# Patient Record
Sex: Female | Born: 1945 | Hispanic: Yes | Marital: Married | Smoking: Former smoker
Health system: Southern US, Community
[De-identification: ages and names within clinical notes are randomized; demographics above are authoritative.]

## PROBLEM LIST (undated history)

## (undated) DIAGNOSIS — K219 Gastro-esophageal reflux disease without esophagitis: Secondary | ICD-10-CM

## (undated) DIAGNOSIS — E7800 Pure hypercholesterolemia, unspecified: Secondary | ICD-10-CM

## (undated) DIAGNOSIS — K2970 Gastritis, unspecified, without bleeding: Secondary | ICD-10-CM

## (undated) DIAGNOSIS — M1990 Unspecified osteoarthritis, unspecified site: Secondary | ICD-10-CM

## (undated) DIAGNOSIS — I10 Essential (primary) hypertension: Secondary | ICD-10-CM

## (undated) DIAGNOSIS — E119 Type 2 diabetes mellitus without complications: Secondary | ICD-10-CM

## (undated) DIAGNOSIS — Z8719 Personal history of other diseases of the digestive system: Secondary | ICD-10-CM

## (undated) DIAGNOSIS — F419 Anxiety disorder, unspecified: Secondary | ICD-10-CM

## (undated) HISTORY — PX: HEMORRHOID SURGERY: SHX153

## (undated) HISTORY — PX: ABDOMINAL HYSTERECTOMY: SHX81

## (undated) HISTORY — PX: UTERINE FIBROID SURGERY: SHX826

## (undated) HISTORY — PX: BREAST BIOPSY: SHX20

## (undated) HISTORY — PX: CATARACT EXTRACTION W/ INTRAOCULAR LENS  IMPLANT, BILATERAL: SHX1307

---

## 2008-06-11 HISTORY — PX: HIP SURGERY: SHX245

## 2009-06-11 HISTORY — PX: JOINT REPLACEMENT: SHX530

## 2016-02-12 ENCOUNTER — Ambulatory Visit (HOSPITAL_COMMUNITY)
Admission: RE | Admit: 2016-02-12 | Discharge: 2016-02-12 | Disposition: A | Discharge status: Home / Self Care | Payer: Medicaid Other | Source: Ambulatory Visit | Attending: Family Medicine | Admitting: Family Medicine

## 2016-02-12 ENCOUNTER — Other Ambulatory Visit: Payer: Self-pay | Admitting: Family Medicine

## 2016-02-12 DIAGNOSIS — M25562 Pain in left knee: Secondary | ICD-10-CM

## 2016-02-12 DIAGNOSIS — Z029 Encounter for administrative examinations, unspecified: Secondary | ICD-10-CM | POA: Diagnosis present

## 2016-05-08 ENCOUNTER — Other Ambulatory Visit: Payer: Self-pay | Admitting: Internal Medicine

## 2016-05-08 DIAGNOSIS — N6092 Unspecified benign mammary dysplasia of left breast: Secondary | ICD-10-CM

## 2016-05-14 ENCOUNTER — Other Ambulatory Visit: Payer: Self-pay | Admitting: Internal Medicine

## 2016-05-14 DIAGNOSIS — N6092 Unspecified benign mammary dysplasia of left breast: Secondary | ICD-10-CM

## 2016-05-23 ENCOUNTER — Ambulatory Visit
Admission: RE | Admit: 2016-05-23 | Discharge: 2016-05-23 | Disposition: A | Discharge status: Home / Self Care | Payer: Medicare HMO | Source: Ambulatory Visit | Attending: Internal Medicine | Admitting: Internal Medicine

## 2016-05-23 ENCOUNTER — Other Ambulatory Visit: Payer: Self-pay | Admitting: Internal Medicine

## 2016-05-23 DIAGNOSIS — Z1231 Encounter for screening mammogram for malignant neoplasm of breast: Secondary | ICD-10-CM

## 2016-05-23 DIAGNOSIS — N6092 Unspecified benign mammary dysplasia of left breast: Secondary | ICD-10-CM

## 2017-01-23 NOTE — Progress Notes (Signed)
Patient called Short Stay regarding 9/5 surgery date.  Used a Research officer, trade unionspanish interpreter number [phone]. Verified name and date of birth.  Patient wanted to know when her pre-op appointment would be within 2 weeks of surgery

## 2017-02-04 ENCOUNTER — Other Ambulatory Visit: Payer: Self-pay | Admitting: Orthopedic Surgery

## 2017-02-05 NOTE — Pre-Procedure Instructions (Signed)
Instrucciones Para Antes de la Ciruga   Su ciruga est programada para-(your procedure is scheduled on) September 5 at 1100   [facility] Admitting - (enter)    Por favor llame al [phone] si tiene algn problema en la maana de la ciruga. (please call if you have any problems the morning of surgery.)                  Recuerde: (Remember)   No coma alimentos ni tome lquidos, incluyendo agua, despus de la medianoche del  (Do not eat food or drink liquids including water after midnight on_______________   [facility] en la maana de la ciruga con un SORBITO de agua (take these meds the morning of surgery with a SIP of water) ________________________________ acetaminophen (TYLENOL)  Eye drops,  omeprazole (PRILOSEC)    Puede cepillarse los dientes en la maana de la Arnold. (you may brush your teeth the morning of surgery)   No use joyas, maquillaje de ojos, lpiz labial, crema para el cuerpo o esmalte de uas oscuro. (Do not wear jewelry, eye makeup, lipstick, body lotion, or dark fingernail polish)     Si va a ser ingresado despues de la ciruga, deje la [employer] en el carro hasta que se le haya asignado una habitacin. (If you are to be admitted after surgery, leave suitcase in car until your room has been assigned.)   A los pacientes que se les d de alta el mismo da no se les permitir manejar a casa.  (Patients discharged on the day of surgery will not be allowed to drive home)   Use ropa suelta y cmoda de regreso a casa. (wear loose comfortable clothes for ride home)    Firma del paciente (patient signature) ______________________________________   [place]  02/05/2017      [id number] - [place], [place] - [address] AT [facility] [address] [facility] [address] [place] [address] Phone: [phone] Fax: [phone]    Your procedure is scheduled  on September 5  Report to [facility] Admitting at 1100 A.M.  Call this number if you have problems the morning of surgery:  [phone]   Remember:  Do not eat food or drink liquids after midnight.  Continue all other medications as directed by your physician except follow these instructions about you medications   Take these medicines the morning of surgery with A SIP OF WATER  acetaminophen (TYLENOL)  Eye drops,  omeprazole (PRILOSEC)    Do not wear jewelry, make-up or nail polish.  Do not wear lotions, powders, or perfumes, or deoderant.  Do not shave 48 hours prior to surgery.  Men may shave face and neck.  Do not bring valuables to the hospital.  [facility] is not responsible for any belongings or valuables.  Contacts, dentures or bridgework may not be worn into surgery.  Leave your suitcase in the car.  After surgery it may be brought to your room.  For patients admitted to the hospital, discharge time will be determined by your treatment team.  Patients discharged the day of surgery will not be allowed to drive home.   Special instructions:   [facility]- Preparing For Surgery  Before surgery, you can play an important role. Because skin is not sterile, your skin needs to be as free of germs as possible. You can reduce the number of germs on your skin by washing with  CHG (chlorahexidine gluconate) Soap before surgery.  CHG is an antiseptic cleaner which kills germs and bonds with the skin to continue killing germs even after washing.  Please do not use if you have an allergy to CHG or antibacterial soaps. If your skin becomes reddened/irritated stop using the CHG.  Do not shave (including legs and underarms) for at least 48 hours prior to first CHG shower. It is OK to shave your face.  Please follow these instructions carefully.   1. Shower the NIGHT BEFORE SURGERY and the MORNING OF SURGERY with CHG.   2. If you chose to wash your hair, wash your hair  first as usual with your normal shampoo.  3. After you shampoo, rinse your hair and body thoroughly to remove the shampoo.  4. Use CHG as you would any other liquid soap. You can apply CHG directly to the skin and wash gently with a scrungie or a clean washcloth.   5. Apply the CHG Soap to your body ONLY FROM THE NECK DOWN.  Do not use on open wounds or open sores. Avoid contact with your eyes, ears, mouth and genitals (private parts). Wash genitals (private parts) with your normal soap.  6. Wash thoroughly, paying special attention to the area where your surgery will be performed.  7. Thoroughly rinse your body with warm water from the neck down.  8. DO NOT shower/wash with your normal soap after using and rinsing off the CHG Soap.  9. Pat yourself dry with a CLEAN TOWEL.   10. Wear CLEAN PAJAMAS   11. Place CLEAN SHEETS on your bed the night of your first shower and DO NOT SLEEP WITH PETS.    Day of Surgery: Do not apply any deodorants/lotions. Please wear clean clothes to the hospital/surgery center.      Please read over the following fact sheets that you were given.

## 2017-02-06 ENCOUNTER — Ambulatory Visit (HOSPITAL_COMMUNITY)
Admission: RE | Admit: 2017-02-06 | Discharge: 2017-02-06 | Disposition: A | Discharge status: Home / Self Care | Payer: Medicare HMO | Source: Ambulatory Visit | Attending: Orthopedic Surgery | Admitting: Orthopedic Surgery

## 2017-02-06 ENCOUNTER — Encounter (HOSPITAL_COMMUNITY): Payer: Self-pay

## 2017-02-06 ENCOUNTER — Encounter (HOSPITAL_COMMUNITY)
Admission: RE | Admit: 2017-02-06 | Discharge: 2017-02-06 | Disposition: A | Discharge status: Home / Self Care | Payer: Medicare HMO | Source: Ambulatory Visit | Attending: Orthopedic Surgery | Admitting: Orthopedic Surgery

## 2017-02-06 DIAGNOSIS — M1611 Unilateral primary osteoarthritis, right hip: Secondary | ICD-10-CM | POA: Diagnosis not present

## 2017-02-06 DIAGNOSIS — Z01810 Encounter for preprocedural cardiovascular examination: Secondary | ICD-10-CM | POA: Diagnosis not present

## 2017-02-06 DIAGNOSIS — R001 Bradycardia, unspecified: Secondary | ICD-10-CM | POA: Diagnosis not present

## 2017-02-06 DIAGNOSIS — Z01818 Encounter for other preprocedural examination: Secondary | ICD-10-CM

## 2017-02-06 HISTORY — DX: Essential (primary) hypertension: I10

## 2017-02-06 HISTORY — DX: Type 2 diabetes mellitus without complications: E11.9

## 2017-02-06 HISTORY — DX: Gastro-esophageal reflux disease without esophagitis: K21.9

## 2017-02-06 HISTORY — DX: Personal history of other diseases of the digestive system: Z87.19

## 2017-02-06 HISTORY — DX: Unspecified osteoarthritis, unspecified site: M19.90

## 2017-02-06 HISTORY — DX: Pure hypercholesterolemia, unspecified: E78.00

## 2017-02-06 HISTORY — DX: Anxiety disorder, unspecified: F41.9

## 2017-02-06 LAB — BASIC METABOLIC PANEL
Anion gap: 8 (ref 5–15)
BUN: 27 mg/dL — ABNORMAL HIGH (ref 6–20)
CO2: 24 mmol/L (ref 22–32)
Calcium: 9.3 mg/dL (ref 8.9–10.3)
Chloride: 106 mmol/L (ref 101–111)
Creatinine, Ser: 1.09 mg/dL — ABNORMAL HIGH (ref 0.44–1.00)
GFR calc Af Amer: 58 mL/min — ABNORMAL LOW (ref >60)
GFR calc non Af Amer: 50 mL/min — ABNORMAL LOW (ref >60)
Glucose, Bld: 112 mg/dL — ABNORMAL HIGH (ref 65–99)
Potassium: 4.5 mmol/L (ref 3.5–5.1)
Sodium: 138 mmol/L (ref 135–145)

## 2017-02-06 LAB — URINALYSIS, ROUTINE W REFLEX MICROSCOPIC
Bilirubin Urine: NEGATIVE
Glucose, UA: NEGATIVE mg/dL
Hgb urine dipstick: NEGATIVE
Ketones, ur: NEGATIVE mg/dL
Leukocytes, UA: NEGATIVE
Nitrite: NEGATIVE
Protein, ur: NEGATIVE mg/dL
Specific Gravity, Urine: 1.009 (ref 1.005–1.030)
pH: 5 (ref 5.0–8.0)

## 2017-02-06 LAB — TYPE AND SCREEN
ABO/RH(D): O POS
Antibody Screen: NEGATIVE

## 2017-02-06 LAB — CBC WITH DIFFERENTIAL/PLATELET
Basophils Absolute: 0 10*3/uL (ref 0.0–0.1)
Basophils Relative: 0 %
Eosinophils Absolute: 0.2 10*3/uL (ref 0.0–0.7)
Eosinophils Relative: 2 %
HCT: 38.3 % (ref 36.0–46.0)
Hemoglobin: 12.5 g/dL (ref 12.0–15.0)
Lymphocytes Relative: 39 %
Lymphs Abs: 3.8 10*3/uL (ref 0.7–4.0)
MCH: 31.9 pg (ref 26.0–34.0)
MCHC: 32.6 g/dL (ref 30.0–36.0)
MCV: 97.7 fL (ref 78.0–100.0)
Monocytes Absolute: 1.1 10*3/uL — ABNORMAL HIGH (ref 0.1–1.0)
Monocytes Relative: 12 %
Neutro Abs: 4.5 10*3/uL (ref 1.7–7.7)
Neutrophils Relative %: 47 %
Platelets: 273 10*3/uL (ref 150–400)
RBC: 3.92 MIL/uL (ref 3.87–5.11)
RDW: 12.9 % (ref 11.5–15.5)
WBC: 9.5 10*3/uL (ref 4.0–10.5)

## 2017-02-06 LAB — ABO/RH: ABO/RH(D): O POS

## 2017-02-06 LAB — SURGICAL PCR SCREEN
MRSA, PCR: NEGATIVE
Staphylococcus aureus: NEGATIVE

## 2017-02-06 LAB — PROTIME-INR
INR: 0.91
Prothrombin Time: 12.2 seconds (ref 11.4–15.2)

## 2017-02-06 LAB — APTT: aPTT: 28 seconds (ref 24–36)

## 2017-02-06 NOTE — Progress Notes (Signed)
PCP - Dr. Luiz Ironabeza Cardiologist - patient denies  Chest x-ray - 02/06/2017 EKG - 02/06/2017 Stress Test - patient denies ECHO - patient denies Cardiac Cath - patient denies  Sleep Study - patient denies   Fasting Blood Sugar - 90-100's Checks Blood Sugar 1-2 times a day     Patient denies shortness of breath, fever, cough and chest pain at PAT appointment   Interpretive services via the iPad were used for this appointment.  Patient verbalized understanding of instructions that were given to them at the PAT appointment. Patient was also instructed that they will need to review over the PAT instructions again at home before surgery.

## 2017-02-08 ENCOUNTER — Encounter: Payer: Self-pay | Admitting: Internal Medicine

## 2017-02-08 ENCOUNTER — Ambulatory Visit (INDEPENDENT_AMBULATORY_CARE_PROVIDER_SITE_OTHER): Payer: Medicare HMO | Admitting: Internal Medicine

## 2017-02-08 VITALS — BP 166/78 | HR 73 | Ht 61.0 in | Wt 154.6 lb

## 2017-02-08 DIAGNOSIS — M1611 Unilateral primary osteoarthritis, right hip: Secondary | ICD-10-CM | POA: Diagnosis present

## 2017-02-08 DIAGNOSIS — Z01810 Encounter for preprocedural cardiovascular examination: Secondary | ICD-10-CM

## 2017-02-08 DIAGNOSIS — E782 Mixed hyperlipidemia: Secondary | ICD-10-CM

## 2017-02-08 DIAGNOSIS — I10 Essential (primary) hypertension: Secondary | ICD-10-CM

## 2017-02-08 NOTE — Patient Instructions (Signed)
Your physician recommends that you schedule a follow-up appointment as needed  

## 2017-02-08 NOTE — H&P (Signed)
TOTAL HIP ADMISSION H&P  Patient is admitted for right total hip arthroplasty.  Subjective:  Chief Complaint: right hip pain  HPI: Madison Myers, 71 y.o. female, has a history of pain and functional disability in the right hip(s) due to arthritis and patient has failed non-surgical conservative treatments for greater than 12 weeks to include NSAID's and/or analgesics, corticosteriod injections, flexibility and strengthening excercises, use of assistive devices, weight reduction as appropriate and activity modification.  Onset of symptoms was gradual starting 2 years ago with gradually worsening course since that time.The patient noted no past surgery on the right hip(s).  Patient currently rates pain in the right hip at 10 out of 10 with activity. Patient has night pain, worsening of pain with activity and weight bearing, pain that interfers with activities of daily living and pain with passive range of motion. Patient has evidence of joint space narrowing by imaging studies. This condition presents safety issues increasing the risk of falls.  There is no current active infection.  There are no active problems to display for this patient.  Past Medical History:  Diagnosis Date  . Anxiety    takes medication as needed  . Arthritis   . Diabetes mellitus without complication (HCC)    controlled with no complications - is not currently taking any medications 02/06/17  . GERD (gastroesophageal reflux disease)   . High cholesterol   . History of hiatal hernia   . Hypertension     Past Surgical History:  Procedure Laterality Date  . ABDOMINAL HYSTERECTOMY    . CATARACT EXTRACTION W/ INTRAOCULAR LENS  IMPLANT, BILATERAL    . HEMORRHOID SURGERY    . HIP SURGERY Left 2010   in [place]  . JOINT REPLACEMENT Left 2011   hip  . UTERINE FIBROID SURGERY      No prescriptions prior to admission.   Allergies  Allergen Reactions  . Carbamazepine Anaphylaxis    Social History  Substance Use  Topics  . Smoking status: Former Smoker    Quit date: 1998  . Smokeless tobacco: Never Used  . Alcohol use No    No family history on file.   Review of Systems  Constitutional: Negative.   HENT: Negative.   Eyes: Negative.   Respiratory: Negative.   Cardiovascular:       HTN  Gastrointestinal: Negative.   Genitourinary: Negative.   Musculoskeletal: Positive for joint pain.  Skin: Negative.   Neurological: Negative.   Endo/Heme/Allergies: Negative.   Psychiatric/Behavioral: Positive for memory loss.    Objective:  Physical Exam  Constitutional: She is oriented to person, place, and time. She appears well-developed and well-nourished.  HENT:  Head: Normocephalic and atraumatic.  Eyes: Pupils are equal, round, and reactive to light.  Neck: Normal range of motion. Neck supple.  Cardiovascular: Intact distal pulses.   Respiratory: Effort normal.  Musculoskeletal:  She continues to have significant pain in the right hip with any internal or external rotation.  She is able to internally rotate to 15-20.  Mild pain in the groin and mild lateral tenderness.  Calves are soft and nontender.  She is neurovascularly intact distally.    Neurological: She is alert and oriented to person, place, and time.  Skin: Skin is warm and dry.  Psychiatric: She has a normal mood and affect. Her behavior is normal. Judgment and thought content normal.    Vital signs in last 24 hours:    Labs:   Estimated body mass index is 28.57 kg/m as  calculated from the following:   Height as of 02/06/17: 5\' 2"  (1.575 m).   Weight as of 02/06/17: 70.9 kg (156 lb 3.2 oz).   Imaging Review Plain radiographs demonstrate  AP pelvis and crosstable lateral of the right and left hips are taken and reviewed in office today.  This shows a left total hip arthroplasty with multiple cerclage wires over the mid femur region.  Patient's right hip does have near end-stage bone-on-bone arthritis over the superior  weightbearing surface.  Assessment/Plan:  End stage arthritis, right hip(s)  The patient history, physical examination, clinical judgement of the provider and imaging studies are consistent with end stage degenerative joint disease of the right hip(s) and total hip arthroplasty is deemed medically necessary. The treatment options including medical management, injection therapy, arthroscopy and arthroplasty were discussed at length. The risks and benefits of total hip arthroplasty were presented and reviewed. The risks due to aseptic loosening, infection, stiffness, dislocation/subluxation,  thromboembolic complications and other imponderables were discussed.  The patient acknowledged the explanation, agreed to proceed with the plan and consent was signed. Patient is being admitted for inpatient treatment for surgery, pain control, PT, OT, prophylactic antibiotics, VTE prophylaxis, progressive ambulation and ADL's and discharge planning.The patient is planning to be discharged to skilled nursing facility

## 2017-02-08 NOTE — Progress Notes (Signed)
OFFICE CONSULT NOTE  Chief Complaint:  Preoperative risk asssessment  Primary Care Physician: [facility]  HPI:  Madison Myers is a 71 y.o. female who is being seen today for the evaluation of preoperative risk assessment at the request of No ref. provider found. This is a pleasant 71 yo [name] who has a history of left hip surgery in [place] which was subsequently revised in [place]. She now presents for worsening right hip pain and had an abnormal preoperative EKG therefore cardiac clearance is requested. I reviewed her EKG which demonstrated a sinus bradycardia and was interpreted by the computer as inferior infarct. The EKG shows significant lead artifact with very low voltage in aVF therefore diagnosis is clouded. We repeated the EKG today in the office which shows normal sinus rhythm and again some inferior lead artifact but no evidence of clear Q waves or prior infarct. She denies any prior history of chest pain. She denies any current chest pain or worsening shortness of breath. She can walk up a flight of stairs without shortness of breath. There is no family history of coronary disease of early onset. She does have some cardiac risk factors including hypertension and dyslipidemia which appear to be well treated.  PMHx:  Past Medical History:  Diagnosis Date  . Anxiety    takes medication as needed  . Arthritis   . Diabetes mellitus without complication (HCC)    controlled with no complications - is not currently taking any medications 02/06/17  . GERD (gastroesophageal reflux disease)   . High cholesterol   . History of hiatal hernia   . Hypertension     Past Surgical History:  Procedure Laterality Date  . ABDOMINAL HYSTERECTOMY    . CATARACT EXTRACTION W/ INTRAOCULAR LENS  IMPLANT, BILATERAL    . HEMORRHOID SURGERY    . HIP SURGERY Left 2010   in [place]  . JOINT REPLACEMENT Left 2011   hip  . UTERINE FIBROID SURGERY       FAMHx:  Family History  Problem Relation Age of Onset  . Breast cancer Mother   . Liver cancer Sister     SOCHx:   reports that she quit smoking about 20 years ago. She has never used smokeless tobacco. She reports that she does not drink alcohol or use drugs.  ALLERGIES:  Allergies  Allergen Reactions  . Carbamazepine Anaphylaxis    ROS: Pertinent items noted in HPI and remainder of comprehensive ROS otherwise negative.  HOME MEDS: Current Outpatient Prescriptions on File Prior to Visit  Medication Sig Dispense Refill  . acetaminophen (TYLENOL) 650 MG CR tablet Take 650 mg by mouth every 8 (eight) hours as needed for pain.    . Cholecalciferol (VITAMIN D PO) Take 1 tablet by mouth daily.    Marland Kitchen conjugated estrogens (PREMARIN) vaginal cream Place 1 Applicatorful vaginally every 7 (seven) days.    Marland Kitchen latanoprost (XALATAN) 0.005 % ophthalmic solution Place 1 drop into both eyes at bedtime.    Marland Kitchen lisinopril-hydrochlorothiazide (PRINZIDE,ZESTORETIC) 10-12.5 MG tablet Take 1 tablet by mouth daily.    . magnesium citrate SOLN Take 1 Bottle by mouth once.    . Omega-3 Fatty Acids (FISH OIL PO) Take 1 capsule by mouth at bedtime.    Marland Kitchen omeprazole (PRILOSEC) 20 MG capsule Take 20 mg by mouth daily as needed (heartburn).    . polyvinyl alcohol (LIQUIFILM TEARS) 1.4 % ophthalmic solution Place 1 drop into both eyes every 4 (four) hours  as needed for dry eyes.    . vitamin E 200 UNIT capsule Take 200 Units by mouth daily.     No current facility-administered medications on file prior to visit.     LABS/IMAGING: No results found for this or any previous visit (from the past 48 hour(s)). No results found.  LIPID PANEL: No results found for: CHOL, TRIG, HDL, CHOLHDL, VLDL, LDLCALC, LDLDIRECT  WEIGHTS: Wt Readings from Last 3 Encounters:  02/08/17 154 lb 9.6 oz (70.1 kg)  02/06/17 156 lb 3.2 oz (70.9 kg)    VITALS: BP (!) 166/78   Pulse 73   Ht 5\' 1"  (1.549 m)   Wt 154 lb 9.6 oz  (70.1 kg)   SpO2 96%   BMI 29.21 kg/m   EXAM: General appearance: alert and no distress Neck: no carotid bruit, no JVD and thyroid not enlarged, symmetric, no tenderness/mass/nodules Lungs: clear to auscultation bilaterally Heart: regular rate and rhythm, S1, S2 normal, no murmur, click, rub or gallop Abdomen: soft, non-tender; bowel sounds normal; no masses,  no organomegaly Extremities: extremities normal, atraumatic, no cyanosis or edema Pulses: 2+ and symmetric Skin: Skin color, texture, turgor normal. No rashes or lesions Neurologic: Grossly normal Psych: Pleasant  EKG: Normal sinus rhythm at 67 - personally reviewed  ASSESSMENT: 1. Low risk for upcoming hip surgery 2. Hypertension-controlled 3. Dyslipidemia  PLAN: 1.   [name] is at low risk for upcoming hip surgery. Her EKG shows sinus rhythm without prior infarct. Blood pressure was repeated at 140/80. She reports generally blood pressure is around 120 to 130 systolic at home. Overall she should be at low risk for upcoming surgery. Follow-up with me as needed.  Thanks for the consultation.  Chrystie NoseKenneth C. Jahlen Bollman, MD, [facility] Limited PartnershipFACC  [place]  [facility]  Attending Cardiologist  Direct Dial: [phone]  Fax: [phone]  Website:  [email or web address]  Judeth Gilles C Jaqlyn Gruenhagen 02/08/2017, 4:58 PM

## 2017-02-12 ENCOUNTER — Ambulatory Visit: Payer: Medicare HMO | Admitting: Internal Medicine

## 2017-02-12 MED ORDER — BUPIVACAINE LIPOSOME 1.3 % IJ SUSP
20.0000 mL | Freq: Once | INTRAMUSCULAR | Status: DC
  Filled 2017-02-12: qty 20

## 2017-02-12 MED ORDER — TRANEXAMIC ACID 1000 MG/10ML IV SOLN
1000.0000 mg | INTRAVENOUS | Status: AC
  Administered 2017-02-13: 1000 mg via INTRAVENOUS
  Administered 2017-02-13: 13:00:00 via INTRAVENOUS
  Filled 2017-02-12: qty 1100

## 2017-02-12 MED ORDER — TRANEXAMIC ACID 1000 MG/10ML IV SOLN
2000.0000 mg | INTRAVENOUS | Status: DC
  Filled 2017-02-12: qty 20

## 2017-02-13 ENCOUNTER — Inpatient Hospital Stay (HOSPITAL_COMMUNITY): Payer: Medicare HMO | Admitting: Anesthesiology

## 2017-02-13 ENCOUNTER — Inpatient Hospital Stay (HOSPITAL_COMMUNITY): Payer: Medicare HMO

## 2017-02-13 ENCOUNTER — Inpatient Hospital Stay (HOSPITAL_COMMUNITY)
Admission: RE | Admit: 2017-02-13 | Discharge: 2017-02-16 | DRG: 470 | Disposition: A | Discharge status: Home Health | Payer: Medicare HMO | Source: Ambulatory Visit | Attending: Orthopedic Surgery | Admitting: Orthopedic Surgery

## 2017-02-13 ENCOUNTER — Encounter (HOSPITAL_COMMUNITY)
Admission: RE | Disposition: A | Discharge status: Home Health | Payer: Self-pay | Source: Ambulatory Visit | Attending: Orthopedic Surgery

## 2017-02-13 ENCOUNTER — Encounter (HOSPITAL_COMMUNITY): Payer: Self-pay | Admitting: Anesthesiology

## 2017-02-13 DIAGNOSIS — Z87891 Personal history of nicotine dependence: Secondary | ICD-10-CM

## 2017-02-13 DIAGNOSIS — K449 Diaphragmatic hernia without obstruction or gangrene: Secondary | ICD-10-CM | POA: Diagnosis present

## 2017-02-13 DIAGNOSIS — F419 Anxiety disorder, unspecified: Secondary | ICD-10-CM | POA: Diagnosis not present

## 2017-02-13 DIAGNOSIS — E119 Type 2 diabetes mellitus without complications: Secondary | ICD-10-CM | POA: Diagnosis not present

## 2017-02-13 DIAGNOSIS — K219 Gastro-esophageal reflux disease without esophagitis: Secondary | ICD-10-CM | POA: Diagnosis not present

## 2017-02-13 DIAGNOSIS — Z419 Encounter for procedure for purposes other than remedying health state, unspecified: Secondary | ICD-10-CM

## 2017-02-13 DIAGNOSIS — M1611 Unilateral primary osteoarthritis, right hip: Secondary | ICD-10-CM | POA: Diagnosis not present

## 2017-02-13 DIAGNOSIS — D62 Acute posthemorrhagic anemia: Secondary | ICD-10-CM | POA: Diagnosis not present

## 2017-02-13 DIAGNOSIS — I10 Essential (primary) hypertension: Secondary | ICD-10-CM | POA: Diagnosis present

## 2017-02-13 HISTORY — PX: TOTAL HIP ARTHROPLASTY: SHX124

## 2017-02-13 LAB — GLUCOSE, CAPILLARY
Glucose-Capillary: 83 mg/dL (ref 65–99)
Glucose-Capillary: 102 mg/dL — ABNORMAL HIGH (ref 65–99)

## 2017-02-13 SURGERY — ARTHROPLASTY, HIP, TOTAL, ANTERIOR APPROACH
Anesthesia: Spinal | Laterality: Right

## 2017-02-13 MED ORDER — ACETAMINOPHEN 325 MG PO TABS
650.0000 mg | ORAL_TABLET | Freq: Four times a day (QID) | ORAL | Status: DC | PRN
  Administered 2017-02-14: 650 mg via ORAL
  Filled 2017-02-13: qty 2

## 2017-02-13 MED ORDER — ONDANSETRON HCL 4 MG/2ML IJ SOLN
INTRAMUSCULAR | Status: DC | PRN
  Administered 2017-02-13: 4 mg via INTRAVENOUS

## 2017-02-13 MED ORDER — ASPIRIN EC 325 MG PO TBEC
325.0000 mg | DELAYED_RELEASE_TABLET | Freq: Every day | ORAL | Status: DC
  Administered 2017-02-14 – 2017-02-16 (×3): 325 mg via ORAL
  Filled 2017-02-13 (×3): qty 1

## 2017-02-13 MED ORDER — HYDROCHLOROTHIAZIDE 12.5 MG PO CAPS
12.5000 mg | ORAL_CAPSULE | Freq: Every day | ORAL | Status: DC
  Administered 2017-02-14 – 2017-02-15 (×2): 12.5 mg via ORAL
  Filled 2017-02-13 (×3): qty 1

## 2017-02-13 MED ORDER — CEFAZOLIN SODIUM-DEXTROSE 2-4 GM/100ML-% IV SOLN
INTRAVENOUS | Status: AC
  Filled 2017-02-13: qty 100

## 2017-02-13 MED ORDER — ONDANSETRON HCL 4 MG/2ML IJ SOLN: 4.0000 mg | Freq: Four times a day (QID) | INTRAMUSCULAR | Status: DC | PRN

## 2017-02-13 MED ORDER — ACETAMINOPHEN 650 MG RE SUPP: 650.0000 mg | Freq: Four times a day (QID) | RECTAL | Status: DC | PRN

## 2017-02-13 MED ORDER — LACTATED RINGERS IV SOLN
INTRAVENOUS | Status: DC
  Administered 2017-02-13: 11:00:00 via INTRAVENOUS

## 2017-02-13 MED ORDER — TIZANIDINE HCL 2 MG PO TABS: 2.0000 mg | ORAL_TABLET | Freq: Four times a day (QID) | ORAL | 0 refills | Status: AC | PRN

## 2017-02-13 MED ORDER — PHENOL 1.4 % MT LIQD: 1.0000 | OROMUCOSAL | Status: DC | PRN

## 2017-02-13 MED ORDER — LISINOPRIL-HYDROCHLOROTHIAZIDE 10-12.5 MG PO TABS: 1.0000 | ORAL_TABLET | Freq: Every day | ORAL | Status: DC

## 2017-02-13 MED ORDER — METOCLOPRAMIDE HCL 5 MG/ML IJ SOLN: 5.0000 mg | Freq: Three times a day (TID) | INTRAMUSCULAR | Status: DC | PRN

## 2017-02-13 MED ORDER — PROPOFOL 500 MG/50ML IV EMUL
INTRAVENOUS | Status: DC | PRN
  Administered 2017-02-13: 75 ug/kg/min via INTRAVENOUS

## 2017-02-13 MED ORDER — SODIUM CHLORIDE 0.9 % IV SOLN
1000.0000 mg | Freq: Once | INTRAVENOUS | Status: AC
  Administered 2017-02-13: 1000 mg via INTRAVENOUS
  Filled 2017-02-13: qty 10

## 2017-02-13 MED ORDER — PANTOPRAZOLE SODIUM 40 MG PO TBEC
40.0000 mg | DELAYED_RELEASE_TABLET | Freq: Every day | ORAL | Status: DC
  Administered 2017-02-13 – 2017-02-16 (×4): 40 mg via ORAL
  Filled 2017-02-13 (×4): qty 1

## 2017-02-13 MED ORDER — BUPIVACAINE-EPINEPHRINE (PF) 0.5% -1:200000 IJ SOLN
INTRAMUSCULAR | Status: AC
  Filled 2017-02-13: qty 30

## 2017-02-13 MED ORDER — DEXAMETHASONE SODIUM PHOSPHATE 10 MG/ML IJ SOLN
INTRAMUSCULAR | Status: AC
  Filled 2017-02-13: qty 1

## 2017-02-13 MED ORDER — BUPIVACAINE IN DEXTROSE 0.75-8.25 % IT SOLN
INTRATHECAL | Status: DC | PRN
  Administered 2017-02-13: 10 mg via INTRATHECAL

## 2017-02-13 MED ORDER — EPHEDRINE SULFATE-NACL 50-0.9 MG/10ML-% IV SOSY
PREFILLED_SYRINGE | INTRAVENOUS | Status: DC | PRN
  Administered 2017-02-13 (×2): 5 mg via INTRAVENOUS

## 2017-02-13 MED ORDER — ASPIRIN EC 325 MG PO TBEC: 325.0000 mg | DELAYED_RELEASE_TABLET | Freq: Two times a day (BID) | ORAL | 0 refills | Status: AC

## 2017-02-13 MED ORDER — BUPIVACAINE-EPINEPHRINE (PF) 0.5% -1:200000 IJ SOLN
INTRAMUSCULAR | Status: DC | PRN
  Administered 2017-02-13: 5 mL via PERINEURAL
  Administered 2017-02-13: 45 mL via PERINEURAL

## 2017-02-13 MED ORDER — PHENYLEPHRINE HCL 10 MG/ML IJ SOLN
INTRAMUSCULAR | Status: DC | PRN
  Administered 2017-02-13: 15 ug/min via INTRAVENOUS

## 2017-02-13 MED ORDER — ONDANSETRON HCL 4 MG/2ML IJ SOLN
INTRAMUSCULAR | Status: AC
  Filled 2017-02-13: qty 2

## 2017-02-13 MED ORDER — LATANOPROST 0.005 % OP SOLN
1.0000 [drp] | Freq: Every day | OPHTHALMIC | Status: DC
  Administered 2017-02-13 – 2017-02-15 (×3): 1 [drp] via OPHTHALMIC
  Filled 2017-02-13: qty 2.5

## 2017-02-13 MED ORDER — 0.9 % SODIUM CHLORIDE (POUR BTL) OPTIME
TOPICAL | Status: DC | PRN
  Administered 2017-02-13: 1000 mL

## 2017-02-13 MED ORDER — BISACODYL 5 MG PO TBEC
5.0000 mg | DELAYED_RELEASE_TABLET | Freq: Every day | ORAL | Status: DC | PRN
  Administered 2017-02-14: 5 mg via ORAL
  Filled 2017-02-13: qty 1

## 2017-02-13 MED ORDER — KCL IN DEXTROSE-NACL 20-5-0.45 MEQ/L-%-% IV SOLN
INTRAVENOUS | Status: DC
  Administered 2017-02-13: 17:00:00 via INTRAVENOUS
  Filled 2017-02-13: qty 1000

## 2017-02-13 MED ORDER — METHOCARBAMOL 1000 MG/10ML IJ SOLN
500.0000 mg | Freq: Four times a day (QID) | INTRAVENOUS | Status: DC | PRN
  Filled 2017-02-13: qty 5

## 2017-02-13 MED ORDER — FENTANYL CITRATE (PF) 100 MCG/2ML IJ SOLN
INTRAMUSCULAR | Status: AC
  Administered 2017-02-13: 50 ug via INTRAVENOUS
  Filled 2017-02-13: qty 2

## 2017-02-13 MED ORDER — OXYCODONE HCL 5 MG PO TABS
ORAL_TABLET | ORAL | Status: AC
  Administered 2017-02-13: 5 mg via ORAL
  Filled 2017-02-13: qty 1

## 2017-02-13 MED ORDER — SENNOSIDES-DOCUSATE SODIUM 8.6-50 MG PO TABS: 1.0000 | ORAL_TABLET | Freq: Every evening | ORAL | Status: DC | PRN

## 2017-02-13 MED ORDER — FLEET ENEMA 7-19 GM/118ML RE ENEM
1.0000 | ENEMA | Freq: Once | RECTAL | Status: DC | PRN
Start: 2017-02-13 — End: 2017-02-16

## 2017-02-13 MED ORDER — FENTANYL CITRATE (PF) 100 MCG/2ML IJ SOLN
25.0000 ug | INTRAMUSCULAR | Status: DC | PRN
  Administered 2017-02-13: 50 ug via INTRAVENOUS

## 2017-02-13 MED ORDER — ALBUMIN HUMAN 5 % IV SOLN
INTRAVENOUS | Status: DC | PRN
  Administered 2017-02-13: 14:00:00 via INTRAVENOUS

## 2017-02-13 MED ORDER — METHOCARBAMOL 500 MG PO TABS
500.0000 mg | ORAL_TABLET | Freq: Four times a day (QID) | ORAL | Status: DC | PRN
  Administered 2017-02-13 – 2017-02-14 (×4): 500 mg via ORAL
  Filled 2017-02-13 (×4): qty 1

## 2017-02-13 MED ORDER — MEPERIDINE HCL 25 MG/ML IJ SOLN: 6.2500 mg | INTRAMUSCULAR | Status: DC | PRN

## 2017-02-13 MED ORDER — DOCUSATE SODIUM 100 MG PO CAPS
100.0000 mg | ORAL_CAPSULE | Freq: Two times a day (BID) | ORAL | Status: DC
  Administered 2017-02-13 – 2017-02-16 (×5): 100 mg via ORAL
  Filled 2017-02-13 (×6): qty 1

## 2017-02-13 MED ORDER — CHLORHEXIDINE GLUCONATE 4 % EX LIQD: 60.0000 mL | Freq: Once | CUTANEOUS | Status: DC

## 2017-02-13 MED ORDER — METOCLOPRAMIDE HCL 5 MG PO TABS: 5.0000 mg | ORAL_TABLET | Freq: Three times a day (TID) | ORAL | Status: DC | PRN

## 2017-02-13 MED ORDER — ONDANSETRON HCL 4 MG PO TABS: 4.0000 mg | ORAL_TABLET | Freq: Four times a day (QID) | ORAL | Status: DC | PRN

## 2017-02-13 MED ORDER — MENTHOL 3 MG MT LOZG: 1.0000 | LOZENGE | OROMUCOSAL | Status: DC | PRN

## 2017-02-13 MED ORDER — POLYVINYL ALCOHOL 1.4 % OP SOLN
1.0000 [drp] | OPHTHALMIC | Status: DC | PRN
  Filled 2017-02-13: qty 15

## 2017-02-13 MED ORDER — CEFAZOLIN SODIUM-DEXTROSE 2-4 GM/100ML-% IV SOLN
2.0000 g | INTRAVENOUS | Status: AC
  Administered 2017-02-13: 2 g via INTRAVENOUS

## 2017-02-13 MED ORDER — CELECOXIB 200 MG PO CAPS
200.0000 mg | ORAL_CAPSULE | Freq: Two times a day (BID) | ORAL | Status: DC
  Administered 2017-02-13 – 2017-02-16 (×6): 200 mg via ORAL
  Filled 2017-02-13 (×6): qty 1

## 2017-02-13 MED ORDER — LISINOPRIL 10 MG PO TABS
10.0000 mg | ORAL_TABLET | Freq: Every day | ORAL | Status: DC
  Administered 2017-02-14 – 2017-02-15 (×2): 10 mg via ORAL
  Filled 2017-02-13 (×3): qty 1

## 2017-02-13 MED ORDER — FENTANYL CITRATE (PF) 100 MCG/2ML IJ SOLN
INTRAMUSCULAR | Status: DC | PRN
  Administered 2017-02-13: 50 ug via INTRAVENOUS

## 2017-02-13 MED ORDER — OXYCODONE HCL 5 MG PO TABS
5.0000 mg | ORAL_TABLET | ORAL | Status: DC | PRN
  Administered 2017-02-13 – 2017-02-14 (×3): 5 mg via ORAL
  Administered 2017-02-16: 10 mg via ORAL
  Filled 2017-02-13: qty 2
  Filled 2017-02-13 (×3): qty 1

## 2017-02-13 MED ORDER — DEXAMETHASONE SODIUM PHOSPHATE 10 MG/ML IJ SOLN
10.0000 mg | Freq: Once | INTRAMUSCULAR | Status: AC
  Administered 2017-02-14: 10 mg via INTRAVENOUS
  Filled 2017-02-13: qty 1

## 2017-02-13 MED ORDER — TRANEXAMIC ACID 1000 MG/10ML IV SOLN
INTRAVENOUS | Status: AC | PRN
  Administered 2017-02-13: 2000 mg via TOPICAL

## 2017-02-13 MED ORDER — OXYCODONE-ACETAMINOPHEN 5-325 MG PO TABS: 1.0000 | ORAL_TABLET | ORAL | 0 refills | Status: AC | PRN

## 2017-02-13 MED ORDER — ALUM & MAG HYDROXIDE-SIMETH 200-200-20 MG/5ML PO SUSP: 30.0000 mL | ORAL | Status: DC | PRN

## 2017-02-13 MED ORDER — BUPIVACAINE LIPOSOME 1.3 % IJ SUSP
INTRAMUSCULAR | Status: DC | PRN
  Administered 2017-02-13: 5 mL
  Administered 2017-02-13: 15 mL

## 2017-02-13 MED ORDER — HYDROMORPHONE HCL 1 MG/ML IJ SOLN: 0.5000 mg | INTRAMUSCULAR | Status: DC | PRN

## 2017-02-13 MED ORDER — GABAPENTIN 300 MG PO CAPS
300.0000 mg | ORAL_CAPSULE | Freq: Three times a day (TID) | ORAL | Status: DC
  Administered 2017-02-13 – 2017-02-16 (×9): 300 mg via ORAL
  Filled 2017-02-13 (×10): qty 1

## 2017-02-13 MED ORDER — EPHEDRINE 5 MG/ML INJ
INTRAVENOUS | Status: AC
  Filled 2017-02-13: qty 10

## 2017-02-13 MED ORDER — DEXAMETHASONE SODIUM PHOSPHATE 10 MG/ML IJ SOLN
INTRAMUSCULAR | Status: DC | PRN
  Administered 2017-02-13: 5 mg via INTRAVENOUS

## 2017-02-13 MED ORDER — DIPHENHYDRAMINE HCL 12.5 MG/5ML PO ELIX: 12.5000 mg | ORAL_SOLUTION | ORAL | Status: DC | PRN

## 2017-02-13 MED ORDER — FENTANYL CITRATE (PF) 250 MCG/5ML IJ SOLN
INTRAMUSCULAR | Status: AC
  Filled 2017-02-13: qty 5

## 2017-02-13 MED ORDER — METHOCARBAMOL 500 MG PO TABS
ORAL_TABLET | ORAL | Status: AC
  Administered 2017-02-13: 500 mg via ORAL
  Filled 2017-02-13: qty 1

## 2017-02-13 SURGICAL SUPPLY — 43 items
BAG DECANTER FOR FLEXI CONT (MISCELLANEOUS) ×2 IMPLANT
BLADE SAW SGTL 18X1.27X75 (BLADE) ×2 IMPLANT
CAPT HIP TOTAL 2 ×2 IMPLANT
COVER PERINEAL POST (MISCELLANEOUS) ×2 IMPLANT
COVER SURGICAL LIGHT HANDLE (MISCELLANEOUS) ×2 IMPLANT
DRAPE C-ARM 42X72 X-RAY (DRAPES) ×2 IMPLANT
DRAPE STERI IOBAN 125X83 (DRAPES) ×2 IMPLANT
DRAPE U-SHAPE 47X51 STRL (DRAPES) ×4 IMPLANT
DRSG AQUACEL AG ADV 3.5X10 (GAUZE/BANDAGES/DRESSINGS) ×2 IMPLANT
DURAPREP 26ML APPLICATOR (WOUND CARE) ×2 IMPLANT
ELECT BLADE 4.0 EZ CLEAN MEGAD (MISCELLANEOUS) ×2
ELECT REM PT RETURN 9FT ADLT (ELECTROSURGICAL) ×2
ELECTRODE BLDE 4.0 EZ CLN MEGD (MISCELLANEOUS) ×1 IMPLANT
ELECTRODE REM PT RTRN 9FT ADLT (ELECTROSURGICAL) ×1 IMPLANT
FACESHIELD WRAPAROUND (MASK) ×4 IMPLANT
GLOVE BIO SURGEON STRL SZ7.5 (GLOVE) ×2 IMPLANT
GLOVE BIO SURGEON STRL SZ8.5 (GLOVE) ×2 IMPLANT
GLOVE BIOGEL PI IND STRL 8 (GLOVE) ×1 IMPLANT
GLOVE BIOGEL PI IND STRL 9 (GLOVE) ×1 IMPLANT
GLOVE BIOGEL PI INDICATOR 8 (GLOVE) ×1
GLOVE BIOGEL PI INDICATOR 9 (GLOVE) ×1
GOWN STRL REUS W/ TWL LRG LVL3 (GOWN DISPOSABLE) ×1 IMPLANT
GOWN STRL REUS W/ TWL XL LVL3 (GOWN DISPOSABLE) ×2 IMPLANT
GOWN STRL REUS W/TWL LRG LVL3 (GOWN DISPOSABLE) ×1
GOWN STRL REUS W/TWL XL LVL3 (GOWN DISPOSABLE) ×2
KIT BASIN OR (CUSTOM PROCEDURE TRAY) ×2 IMPLANT
KIT ROOM TURNOVER OR (KITS) ×2 IMPLANT
MANIFOLD NEPTUNE II (INSTRUMENTS) ×2 IMPLANT
NEEDLE HYPO 22GX1.5 SAFETY (NEEDLE) ×4 IMPLANT
NS IRRIG 1000ML POUR BTL (IV SOLUTION) ×2 IMPLANT
PACK TOTAL JOINT (CUSTOM PROCEDURE TRAY) ×2 IMPLANT
PAD ARMBOARD 7.5X6 YLW CONV (MISCELLANEOUS) ×4 IMPLANT
SUT ETHIBOND NAB CT1 #1 30IN (SUTURE) ×2 IMPLANT
SUT VIC AB 1 CTX 36 (SUTURE) ×2
SUT VIC AB 1 CTX36XBRD ANBCTR (SUTURE) ×2 IMPLANT
SUT VIC AB 2-0 CT1 27 (SUTURE) ×2
SUT VIC AB 2-0 CT1 TAPERPNT 27 (SUTURE) ×2 IMPLANT
SUT VIC AB 3-0 PS2 18 (SUTURE) ×2
SUT VIC AB 3-0 PS2 18XBRD (SUTURE) ×2 IMPLANT
SYR CONTROL 10ML LL (SYRINGE) ×4 IMPLANT
TOWEL OR 17X24 6PK STRL BLUE (TOWEL DISPOSABLE) ×2 IMPLANT
TOWEL OR 17X26 10 PK STRL BLUE (TOWEL DISPOSABLE) ×2 IMPLANT
TRAY CATH 16FR W/PLASTIC CATH (SET/KITS/TRAYS/PACK) ×2 IMPLANT

## 2017-02-13 NOTE — Discharge Instructions (Signed)

## 2017-02-13 NOTE — Op Note (Signed)
OPERATIVE REPORT    DATE OF PROCEDURE:  02/13/2017       PREOPERATIVE DIAGNOSIS:  RIGHT HIP OSTEOARTHRITIS                                                          POSTOPERATIVE DIAGNOSIS:  RIGHT HIP OSTEOARTHRITIS                                                           PROCEDURE: Anterior R total hip arthroplasty using a 48 mm DePuy Pinnacle  Cup, [employer], 0-degree polyethylene liner, a +1 32 mm ceramic head, a 0 hi Depuy Triloc stem   SURGEON: ZOXWR,UEAVW J    ASSISTANT:   Eric K. [employer]  (present throughout entire procedure and necessary for timely completion of the procedure)   ANESTHESIA: Spinal BLOOD LOSS: 300 FLUID REPLACEMENT: 1500 crystalloid Antibiotic: 2gm ancef Tranexamic Acid: 1gm IV, 2gm Topical COMPLICATIONS: none    INDICATIONS FOR PROCEDURE: A 71 y.o. year-old With  RIGHT HIP OSTEOARTHRITIS   for 3 years, x-rays show bone-on-bone arthritic changes, and osteophytes. Despite conservative measures with observation, anti-inflammatory medicine, narcotics, use of a cane, has severe unremitting pain and can ambulate only a few blocks before resting. Patient desires elective R total hip arthroplasty to decrease pain and increase function. The risks, benefits, and alternatives were discussed at length including but not limited to the risks of infection, bleeding, nerve injury, stiffness, blood clots, the need for revision surgery, cardiopulmonary complications, among others, and they were willing to proceed. Questions answered     PROCEDURE IN DETAIL: The patient was identified by armband,  received preoperative IV antibiotics in the holding area at [facility], taken to the operating room , appropriate anesthetic monitors  were attached and  anesthesia was induced with the patienton the gurney. The HANA boots were applied to the feet and he was then transferred to the HANA table with a peroneal post and support underneath the non-operative le,  which was locked in 5 lb traction. Theoperative lower extremity was then prepped and draped in the usual sterile fashion from just above the iliac crest to the knee. And a timeout procedure was performed. We then made a 12 cm incision along the interval at the leading edge of the tensor fascia lata of starting at 2 cm lateral to and 2 cm distal to the ASIS. Small bleeders in the skin and subcutaneous tissue identified and cauterized we dissected down to the fascia and made an incision in the fascia allowing [place] to elevate the fascia of the tensor muscle and exploited the interval between the rectus and the tensor fascia lata. A Hohmann retractor was then placed along the superior neck of the femur and a Cobra retractor along the inferior neck of the femur we teed the capsule starting out at the superior anterior aspect of the acetabulum going distally and made the T along the neck both leaflets of the T were tagged with #2 Ethibond suture. Cobra retractors were then placed along the inferior and superior neck allowing [place] to perform a standard neck cut and  removed the femoral head with a power corkscrew. We then placed a right angle Hohmann retractor along the anterior aspect of the acetabulum a spiked Cobra in the cotyloid notch and posteriorly a Muelller retractor. We then sequentially reamed up to a 47 mm basket reamer obtaining good coverage in all quadrants, verified by C-arm imaging. Under C-arm control with and hammered into place a 48 mm Pinnacle cup in 45 of abduction and 15 of anteversion. The cup seated nicely and required no supplemental screws. We then placed a central hole Eliminator and a 0 polyethylene liner. The foot was then externally rotated to 110, the HANA elevator was placed around the flare of the greater trochanter and the limb was extended and abducted delivering the proximal femur up into the wound. A medium Hohmann retractor was placed over the greater trochanter and a Mueller retractor  along the posterior femoral neck completing the exposure. We then performed releases superiorly and and inferiorly of the capsule going back to the pirformis fossa superiorly and to the lesser trochanter inferiorly. We then entered the proximal femur with the box cutting offset chisel followed by, a canal sounder, the chili pepper and broaching up to a 0 broach. This seated nicely and we reamed the calcar. A trial reduction was performed with a 1.5 mm 32 mm head.The limb lengths were excellent the hip was stable in 90 of external rotation. At this point the trial components removed and we hammered into place a # hi  Offset Tri-Lock stem with Gryption coating. A + 1.5 32 mm ceramic ball was then hammered into place the hip was reduced and final C-arm images obtained. The wound was thoroughly irrigated with normal saline solution. We repaired the ant capsule and the tensor fascia lot a with running 0 vicryl suture. the subcutaneous tissue was closed with 2-0 and 3-0 Vicryl suture followed by an Aquacil dressing. At this point the patient was awaken and transferred to hospital gurney without difficulty. The subcutaneous tissue with 0 and 2-0 undyed Vicryl suture and the skin with running  3-0 vicryl subcuticular suture. Aquacil dressing was applied. The patient was then unclamped, rolled supine, awaken extubated and taken to recovery room without difficulty in stable condition.   Nemesio Castrillon J 02/13/2017, 2:01 PM

## 2017-02-13 NOTE — Anesthesia Procedure Notes (Signed)
Procedure Name: MAC Date/Time: 02/13/2017 12:49 PM Performed by: Kyung Rudd Pre-anesthesia Checklist: Patient identified, Emergency Drugs available, Suction available and Patient being monitored Patient Re-evaluated:Patient Re-evaluated prior to induction Oxygen Delivery Method: Simple face mask Induction Type: IV induction Placement Confirmation: positive ETCO2 Dental Injury: Teeth and Oropharynx as per pre-operative assessment

## 2017-02-13 NOTE — Progress Notes (Signed)
Report given to jena rn as caregiver 

## 2017-02-13 NOTE — Anesthesia Procedure Notes (Signed)
Spinal  Patient location during procedure: OR Start time: 02/13/2017 12:35 PM End time: 02/13/2017 12:45 PM Staffing Anesthesiologist: Ngina Royer Preanesthetic Checklist Completed: patient identified, site marked, surgical consent, pre-op evaluation, timeout performed, IV checked, risks and benefits discussed and monitors and equipment checked Spinal Block Patient position: sitting Prep: DuraPrep Patient monitoring: heart rate, cardiac monitor, continuous pulse ox and blood pressure Approach: midline Location: L3-4 Injection technique: single-shot Needle Needle type: Sprotte  Needle gauge: 24 G Needle length: 9 cm Assessment Sensory level: T4

## 2017-02-13 NOTE — Evaluation (Signed)
Physical Therapy Evaluation Patient Details Name: Madison Myers MRN: [id number] DOB: [birth year] Today's Date: 02/13/2017   History of Present Illness  Pt is a 71 y/o female s/p R THA, direct anterior approach. PMH includes anxiety, DM, HTN, and L THA.   Clinical Impression  Pt s/p surgery above with deficits below. PTA, pt was using RW for mobility. Upon eval, pt limited by post op pain and weakness, as well as, decreased balance. Pt's family reports pt will be going to SNF at d/c before going home. Feel this is appropriate given current limitations to increase independence and safety with functional mobility. Will continue to follow acutely to progress mobility.     Follow Up Recommendations SNF;Supervision/Assistance - 24 hour    Equipment Recommendations  None recommended by PT    Recommendations for Other Services       Precautions / Restrictions Precautions Precautions: None Precaution Comments: Reviewed THA exercise handout with pt.  Restrictions Weight Bearing Restrictions: Yes RLE Weight Bearing: Weight bearing as tolerated      Mobility  Bed Mobility Overal bed mobility: Needs Assistance Bed Mobility: Supine to Sit     Supine to sit: Min assist     General bed mobility comments: Min A for RLE management.   Transfers Overall transfer level: Needs assistance Equipment used: Rolling walker (2 wheeled) Transfers: Sit to/from Stand Sit to Stand: Min assist         General transfer comment: Min A for lift assist and steadying. Verbal and manual cues for safe hand placement.   Ambulation/Gait Ambulation/Gait assistance: Min assist Ambulation Distance (Feet): 5 Feet Assistive device: Rolling walker (2 wheeled) Gait Pattern/deviations: Step-to pattern;Decreased step length - right;Decreased step length - left;Decreased weight shift to right;Antalgic Gait velocity: Decreased Gait velocity interpretation: Below normal speed for age/gender General Gait  Details: Slow, antalgic gait. Slight buckling in RLE noted at beginning of gait, however, instructed pt to press through UE. Verbal cues for sequencing with RW   Stairs            Wheelchair Mobility    Modified Rankin (Stroke Patients Only)       Balance Overall balance assessment: Needs assistance Sitting-balance support: No upper extremity supported;Feet supported Sitting balance-Leahy Scale: Good     Standing balance support: Bilateral upper extremity supported;During functional activity Standing balance-Leahy Scale: Poor Standing balance comment: Reliant on RW for stability                              Pertinent Vitals/Pain Pain Assessment: 0-10 Pain Score: 4  Pain Location: R hip  Pain Descriptors / Indicators: Aching;Operative site guarding Pain Intervention(s): Limited activity within patient's tolerance;Monitored during session;Repositioned    Home Living Family/patient expects to be discharged to:: Skilled nursing facility                 Additional Comments: Pt's family reports she will be going to rehab at d/c     Prior Function Level of Independence: Independent with assistive device(s)         Comments: Used RW at baseline      Hand Dominance        Extremity/Trunk Assessment   Upper Extremity Assessment Upper Extremity Assessment: Overall WFL for tasks assessed    Lower Extremity Assessment Lower Extremity Assessment: RLE deficits/detail RLE Deficits / Details: Deficits consistent with post op pain and weakness. Able to perform exercises below.     Cervical /  Trunk Assessment Cervical / Trunk Assessment: Normal  Communication   Communication: Prefers language other than [place];Other (comment) (Family interpreted during session )  Cognition Arousal/Alertness: Awake/alert Behavior During Therapy: WFL for tasks assessed/performed Overall Cognitive Status: Within Functional Limits for tasks assessed                                         General Comments General comments (skin integrity, edema, etc.): Pt's family interpreted throughout session     Exercises Total Joint Exercises Ankle Circles/Pumps: AROM;Both;20 reps;Supine Quad Sets: AROM;Right;10 reps;Supine Heel Slides: AROM;Right;10 reps;Supine Hip ABduction/ADduction: AROM;Right;10 reps;Supine   Assessment/Plan    PT Assessment Patient needs continued PT services  PT Problem List Decreased strength;Decreased range of motion;Decreased balance;Decreased mobility;Decreased knowledge of use of DME;Pain       PT Treatment Interventions DME instruction;Gait training;Functional mobility training;Therapeutic activities;Therapeutic exercise;Balance training;Neuromuscular re-education;Patient/family education    PT Goals (Current goals can be found in the Care Plan section)  Acute Rehab PT Goals Patient Stated Goal: none stated PT Goal Formulation: With patient Time For Goal Achievement: 02/20/17 Potential to Achieve Goals: Good    Frequency 7X/week   Barriers to discharge        Co-evaluation               AM-PAC PT "6 Clicks" Daily Activity  Outcome Measure Difficulty turning over in bed (including adjusting bedclothes, sheets and blankets)?: Unable Difficulty moving from lying on back to sitting on the side of the bed? : Unable Difficulty sitting down on and standing up from a chair with arms (e.g., wheelchair, bedside commode, etc,.)?: Unable Help needed moving to and from a bed to chair (including a wheelchair)?: A Little Help needed walking in hospital room?: A Little Help needed climbing 3-5 steps with a railing? : A Lot 6 Click Score: 11    End of Session Equipment Utilized During Treatment: Gait belt Activity Tolerance: Patient tolerated treatment well Patient left: in chair;with call bell/phone within reach;with family/visitor present Nurse Communication: Mobility status PT Visit Diagnosis: Other  abnormalities of gait and mobility (R26.89);Pain Pain - Right/Left: Right Pain - part of body: Hip    Time: 1715-1735 PT Time Calculation (min) (ACUTE ONLY): 20 min   Charges:   PT Evaluation $PT Eval Low Complexity: 1 Low     PT G Codes:        Gladys Damme, PT, DPT  Acute Rehabilitation Services  Pager: [phone]   Lehman Prom 02/13/2017, 6:45 PM

## 2017-02-13 NOTE — Transfer of Care (Signed)
Immediate Anesthesia Transfer of Care Note  Patient: Madison CommonMaria Myers  Procedure(s) Performed: Procedure(s) with comments: TOTAL HIP ARTHROPLASTY ANTERIOR APPROACH (Right) - REQUESTED TIME: 90 MINS  Patient Location: PACU  Anesthesia Type:Spinal  Level of Consciousness: awake, alert  and oriented  Airway & Oxygen Therapy: Patient Spontanous Breathing and Patient connected to nasal cannula oxygen  Post-op Assessment: Report given to RN, Post -op Vital signs reviewed and stable and Patient moving all extremities X 4  Post vital signs: Reviewed and stable  Last Vitals:  Vitals:   02/13/17 1035  BP: (!) 155/59  Pulse: 68  Resp: 19  Temp: 36.6 C  SpO2: 100%    Last Pain:  Vitals:   02/13/17 1035  TempSrc: Oral         Complications: No apparent anesthesia complications

## 2017-02-13 NOTE — Interval H&P Note (Signed)
History and Physical Interval Note:  02/13/2017 11:55 AM  Madison Myers  has presented today for surgery, with the diagnosis of RIGHT HIP OSTEOARTHRITIS  The various methods of treatment have been discussed with the patient and family. After consideration of risks, benefits and other options for treatment, the patient has consented to  Procedure(s) with comments: TOTAL HIP ARTHROPLASTY ANTERIOR APPROACH (Right) - REQUESTED TIME: 90 MINS as a surgical intervention .  The patient's history has been reviewed, patient examined, no change in status, stable for surgery.  I have reviewed the patient's chart and labs.  Questions were answered to the patient's satisfaction.     Nestor LewandowskyOWAN,Feliz Herard J

## 2017-02-13 NOTE — Anesthesia Preprocedure Evaluation (Addendum)
Anesthesia Evaluation  Patient identified by MRN, date of birth, ID band Patient awake    Reviewed: Allergy & Precautions, NPO status , Patient's Chart, lab work & pertinent test results  Airway Mallampati: II  TM Distance: >3 FB Neck ROM: Full    Dental no notable dental hx. (+) Edentulous Upper, Edentulous Lower, Upper Dentures, Lower Dentures   Pulmonary neg pulmonary ROS, former smoker,    Pulmonary exam normal breath sounds clear to auscultation       Cardiovascular hypertension, negative cardio ROS Normal cardiovascular exam Rhythm:Regular Rate:Normal     Neuro/Psych Anxiety negative neurological ROS  negative psych ROS   GI/Hepatic negative GI ROS, Neg liver ROS, hiatal hernia, GERD  ,  Endo/Other  negative endocrine ROSdiabetes, Well Controlled  Renal/GU negative Renal ROS  negative genitourinary   Musculoskeletal negative musculoskeletal ROS (+) Arthritis , Osteoarthritis,    Abdominal   Peds negative pediatric ROS (+)  Hematology negative hematology ROS (+)   Anesthesia Other Findings   Reproductive/Obstetrics negative OB ROS                           Anesthesia Physical Anesthesia Plan  ASA: II  Anesthesia Plan: Spinal   Post-op Pain Management:    Induction:   PONV Risk Score and Plan: 2 and Ondansetron, Dexamethasone and Treatment may vary due to age or medical condition  Airway Management Planned: Nasal Cannula and Natural Airway  Additional Equipment:   Intra-op Plan:   Post-operative Plan:   Informed Consent:   Plan Discussed with:   Anesthesia Plan Comments: (  )        Anesthesia Quick Evaluation

## 2017-02-13 NOTE — Anesthesia Postprocedure Evaluation (Signed)
Anesthesia Post Note  Patient: Madison CommonMaria Myers  Procedure(s) Performed: Procedure(s) (LRB): TOTAL HIP ARTHROPLASTY ANTERIOR APPROACH (Right)     Patient location during evaluation: PACU Anesthesia Type: Spinal Level of consciousness: oriented and awake and alert Pain management: pain level controlled Vital Signs Assessment: post-procedure vital signs reviewed and stable Respiratory status: spontaneous breathing, respiratory function stable and patient connected to nasal cannula oxygen Cardiovascular status: blood pressure returned to baseline and stable Postop Assessment: no headache and no backache Anesthetic complications: no    Last Vitals:  Vitals:   02/13/17 1035  BP: (!) 155/59  Pulse: 68  Resp: 19  Temp: 36.6 C  SpO2: 100%    Last Pain:  Vitals:   02/13/17 1445  TempSrc:   PainSc: (P) 0-No pain    LLE Motor Response: (P) No movement due to regional block (02/13/17 1445) LLE Sensation: (P) Numbness (02/13/17 1445) RLE Motor Response: (P) No movement due to regional block (02/13/17 1445) RLE Sensation: (P) Numbness (02/13/17 1445) L Sensory Level: (P) L5-Outer lower leg, top of foot, great toe (02/13/17 1445) R Sensory Level: (P) L5-Outer lower leg, top of foot, great toe (02/13/17 1445)  Bailie Christenbury

## 2017-02-14 ENCOUNTER — Encounter (HOSPITAL_COMMUNITY): Payer: Self-pay | Admitting: Orthopedic Surgery

## 2017-02-14 LAB — CBC
HCT: 29.9 % — ABNORMAL LOW (ref 36.0–46.0)
Hemoglobin: 9.9 g/dL — ABNORMAL LOW (ref 12.0–15.0)
MCH: 31.9 pg (ref 26.0–34.0)
MCHC: 33.1 g/dL (ref 30.0–36.0)
MCV: 96.5 fL (ref 78.0–100.0)
Platelets: 203 10*3/uL (ref 150–400)
RBC: 3.1 MIL/uL — ABNORMAL LOW (ref 3.87–5.11)
RDW: 12.7 % (ref 11.5–15.5)
WBC: 9.3 10*3/uL (ref 4.0–10.5)

## 2017-02-14 NOTE — Progress Notes (Signed)
Physical Therapy Treatment Patient Details Name: Madison Myers MRN: [id number] DOB: 12-20-1945 Today's Date: 02/14/2017    History of Present Illness Pt is a 71 y/o female s/p R THA, direct anterior approach. PMH includes anxiety, DM, HTN, and L THA.     PT Comments    Pt performed increased gait and reviewed supine exercise.  Remain to recommend placement for SNF as patient remains to require assistance with functional mobility.     Follow Up Recommendations  SNF;Supervision/Assistance - 24 hour     Equipment Recommendations  None recommended by PT    Recommendations for Other Services       Precautions / Restrictions Precautions Precautions: None Precaution Comments: Reviewed THA exercise handout with pt.  Restrictions Weight Bearing Restrictions: Yes RLE Weight Bearing: Weight bearing as tolerated    Mobility  Bed Mobility Overal bed mobility: Needs Assistance Bed Mobility: Supine to Sit     Supine to sit: Min assist     General bed mobility comments: Min A for RLE management.   Transfers Overall transfer level: Needs assistance Equipment used: Rolling walker (2 wheeled) Transfers: Sit to/from Stand Sit to Stand: Supervision         General transfer comment: Cues for hand placement to and from seated surface.    Ambulation/Gait Ambulation/Gait assistance: Min guard Ambulation Distance (Feet): 80 Feet Assistive device: Rolling walker (2 wheeled) Gait Pattern/deviations: Step-through pattern;Trunk flexed;Antalgic Gait velocity: Decreased   General Gait Details: Cues for equal stride length and forward gaze.     Stairs            Wheelchair Mobility    Modified Rankin (Stroke Patients Only)       Balance Overall balance assessment: Needs assistance   Sitting balance-Leahy Scale: Good       Standing balance-Leahy Scale: Poor                              Cognition Arousal/Alertness: Awake/alert Behavior During  Therapy: WFL for tasks assessed/performed Overall Cognitive Status: Within Functional Limits for tasks assessed                                        Exercises Total Joint Exercises Ankle Circles/Pumps: AROM;Both;20 reps;Supine Quad Sets: AROM;Right;10 reps;Supine Short Arc Quad: AROM;Right;10 reps;Supine Heel Slides: AROM;Right;10 reps;Supine Hip ABduction/ADduction: AROM;Right;10 reps;Supine    General Comments        Pertinent Vitals/Pain Pain Assessment: 0-10 Pain Location: R hip  Pain Descriptors / Indicators: Aching;Operative site guarding Pain Intervention(s): Monitored during session;Repositioned    Home Living                      Prior Function            PT Goals (current goals can now be found in the care plan section) Acute Rehab PT Goals Patient Stated Goal: none stated Potential to Achieve Goals: Good Progress towards PT goals: Progressing toward goals    Frequency    7X/week      PT Plan Current plan remains appropriate    Co-evaluation              AM-PAC PT "6 Clicks" Daily Activity  Outcome Measure  Difficulty turning over in bed (including adjusting bedclothes, sheets and blankets)?: Unable Difficulty moving from lying on back to sitting on  the side of the bed? : Unable Difficulty sitting down on and standing up from a chair with arms (e.g., wheelchair, bedside commode, etc,.)?: Unable Help needed moving to and from a bed to chair (including a wheelchair)?: A Little Help needed walking in hospital room?: A Little Help needed climbing 3-5 steps with a railing? : A Little 6 Click Score: 12    End of Session Equipment Utilized During Treatment: Gait belt Activity Tolerance: Patient tolerated treatment well Patient left: in chair;with call bell/phone within reach;with family/visitor present Nurse Communication: Mobility status PT Visit Diagnosis: Other abnormalities of gait and mobility (R26.89);Pain Pain  - Right/Left: Right Pain - part of body: Hip     Time: [id number] PT Time Calculation (min) (ACUTE ONLY): 24 min  Charges:  $Gait Training: 8-22 mins $Therapeutic Exercise: 8-22 mins                    G Codes:       Joycelyn RuaAimee Jerzey Komperda, PTA pager [phone]    Florestine Aversimee J Nyleah Mcginnis 02/14/2017, 4:48 PM

## 2017-02-14 NOTE — Progress Notes (Signed)
PATIENT ID: Madison Myers  MRN: [id number]  DOB/AGE:  1945/10/19 / 71 y.o.  1 Day Post-Op Procedure(s) (LRB): TOTAL HIP ARTHROPLASTY ANTERIOR APPROACH (Right)    PROGRESS NOTE Subjective: Patient is alert, oriented, no Nausea, no Vomiting, yes passing gas, . Taking PO well. Denies SOB, Chest or Calf Pain. Using Incentive Spirometer, PAS in place. Ambulate WBAT up to batroom Patient reports pain as  3/10  .    Objective: Vital signs in last 24 hours: Vitals:   02/13/17 1627 02/13/17 2300 02/14/17 1500 02/14/17 2132  BP: 119/63 (!) 123/59 (!) 100/53 (!) 100/51  Pulse: 60 (!) 59 66 (!) 55  Resp: 16 16  17   Temp: (!) 97.5 F (36.4 C) (!) 97.5 F (36.4 C) (!) 97 F (36.1 C) 97.9 F (36.6 C)  TempSrc: Oral Oral Oral Oral  SpO2: 96% 95% 98% 99%      Intake/Output from previous day: I/O last 3 completed shifts: In: 3059.2 [P.O.:730; I.V.:2079.2; IV Piggyback:250] Out: 500 [Urine:200; Blood:300]   Intake/Output this shift: No intake/output data recorded.   LABORATORY DATA:  Recent Labs  02/13/17 1050 02/13/17 1509 02/14/17 0433  WBC  --   --  9.3  HGB  --   --  9.9*  HCT  --   --  29.9*  PLT  --   --  203  GLUCAP 83 102*  --     Examination: Neurologically intact ABD soft Neurovascular intact Sensation intact distally Intact pulses distally Dorsiflexion/Plantar flexion intact Incision: dressing C/D/I No cellulitis present Compartment soft} XR AP&Lat of hip shows well placed\fixed THA  Assessment:   1 Day Post-Op Procedure(s) (LRB): TOTAL HIP ARTHROPLASTY ANTERIOR APPROACH (Right) ADDITIONAL DIAGNOSIS:  Expected Acute Blood Loss Anemia, Hypertension  Plan: PT/OT WBAT, THA  DVT Prophylaxis: SCDx72 hrs, ASA 325 mg BID x 2 weeks  DISCHARGE PLAN: Skilled Nursing Facility/Rehab  DISCHARGE NEEDS: HHPT, Walker and 3-in-1 comode seatPatient ID: Madison Myers, female   DOB: 1945/10/19, 71 y.o.   MRN: [id number]

## 2017-02-15 LAB — CBC
HCT: 27.6 % — ABNORMAL LOW (ref 36.0–46.0)
Hemoglobin: 9 g/dL — ABNORMAL LOW (ref 12.0–15.0)
MCH: 31.4 pg (ref 26.0–34.0)
MCHC: 32.6 g/dL (ref 30.0–36.0)
MCV: 96.2 fL (ref 78.0–100.0)
Platelets: 196 10*3/uL (ref 150–400)
RBC: 2.87 MIL/uL — ABNORMAL LOW (ref 3.87–5.11)
RDW: 12.6 % (ref 11.5–15.5)
WBC: 11.2 10*3/uL — ABNORMAL HIGH (ref 4.0–10.5)

## 2017-02-15 LAB — GLUCOSE, CAPILLARY: Glucose-Capillary: 112 mg/dL — ABNORMAL HIGH (ref 65–99)

## 2017-02-15 NOTE — Discharge Summary (Addendum)
Patient ID: Madison Myers MRN: [id number] DOB/AGE: 70/27/1947 70 y.o.  Admit date: 02/13/2017 Discharge date: 02/16/2017  Admission Diagnoses:  Principal Problem:   Osteoarthritis of right hip Active Problems:   Primary osteoarthritis of right hip   Discharge Diagnoses:  Same  Past Medical History:  Diagnosis Date  . Anxiety    takes medication as needed  . Arthritis   . Diabetes mellitus without complication (HCC)    controlled with no complications - is not currently taking any medications 02/06/17  . GERD (gastroesophageal reflux disease)   . High cholesterol   . History of hiatal hernia   . Hypertension     Surgeries: Procedure(s): TOTAL HIP ARTHROPLASTY ANTERIOR APPROACH on 02/13/2017   Consultants:   Discharged Condition: Improved  Hospital Course: Madison Myers is an 71 y.o. female who was admitted 02/13/2017 for operative treatment ofOsteoarthritis of right hip. Patient has severe unremitting pain that affects sleep, daily activities, and work/hobbies. After pre-op clearance the patient was taken to the operating room on 02/13/2017 and underwent  Procedure(s): TOTAL HIP ARTHROPLASTY ANTERIOR APPROACH.    Patient was given perioperative antibiotics: Anti-infectives    Start     Dose/Rate Route Frequency Ordered Stop   02/13/17 1056  ceFAZolin (ANCEF) 2-4 GM/100ML-% IVPB    Comments:  Dia CrawfordGallman, Kathie   : cabinet override      02/13/17 1056 02/13/17 1242   02/13/17 1053  ceFAZolin (ANCEF) IVPB 2g/100 mL premix     2 g 200 mL/hr over 30 Minutes Intravenous On call to O.R. 02/13/17 1053 02/13/17 1252       Patient was given sequential compression devices, early ambulation, and chemoprophylaxis to prevent DVT.  Patient benefited maximally from hospital stay and there were no complications.    Recent vital signs: Patient Vitals for the past 24 hrs:  BP Temp Temp src Pulse Resp SpO2  02/15/17 0543 (!) 123/54 97.7 F (36.5 C) Oral (!) 53 18 100 %  02/14/17  2132 (!) 100/51 97.9 F (36.6 C) Oral (!) 55 17 99 %  02/14/17 1500 (!) 100/53 (!) 97 F (36.1 C) Oral 66 - 98 %     Recent laboratory studies:  Recent Labs  02/14/17 0433 02/15/17 0430  WBC 9.3 11.2*  HGB 9.9* 9.0*  HCT 29.9* 27.6*  PLT 203 196     Discharge Medications:   Allergies as of 02/15/2017      Reactions   Carbamazepine Anaphylaxis      Medication List    STOP taking these medications   acetaminophen 650 MG CR tablet Commonly known as:  TYLENOL     TAKE these medications   aspirin EC 325 MG tablet Take 1 tablet (325 mg total) by mouth 2 (two) times daily.   conjugated estrogens vaginal cream Commonly known as:  PREMARIN Place 1 Applicatorful vaginally every 7 (seven) days.   FISH OIL PO Take 1 capsule by mouth at bedtime.   latanoprost 0.005 % ophthalmic solution Commonly known as:  XALATAN Place 1 drop into both eyes at bedtime.   lisinopril-hydrochlorothiazide 10-12.5 MG tablet Commonly known as:  PRINZIDE,ZESTORETIC Take 1 tablet by mouth daily.   magnesium citrate Soln Take 1 Bottle by mouth once.   omeprazole 20 MG capsule Commonly known as:  PRILOSEC Take 20 mg by mouth daily as needed (heartburn).   oxyCODONE-acetaminophen 5-325 MG tablet Commonly known as:  ROXICET Take 1 tablet by mouth every 4 (four) hours as needed.   polyvinyl alcohol 1.4 % ophthalmic  solution Commonly known as:  LIQUIFILM TEARS Place 1 drop into both eyes every 4 (four) hours as needed for dry eyes.   rosuvastatin 40 MG tablet Commonly known as:  CRESTOR Take 40 mg by mouth daily.   tiZANidine 2 MG tablet Commonly known as:  ZANAFLEX Take 1 tablet (2 mg total) by mouth every 6 (six) hours as needed for muscle spasms.   VITAMIN D PO Take 1 tablet by mouth daily.   vitamin E 200 UNIT capsule Take 200 Units by mouth daily.            Durable Medical Equipment        Start     Ordered   02/13/17 1619  DME Walker rolling  Once    Question:   Patient needs a walker to treat with the following condition  Answer:  Status post right hip replacement   02/13/17 1618   02/13/17 1619  DME 3 n 1  Once     02/13/17 1618   02/13/17 1619  DME Bedside commode  Once    Question:  Patient needs a bedside commode to treat with the following condition  Answer:  Status post right hip replacement   02/13/17 1618       Discharge Care Instructions        Start     Ordered   02/15/17 0000  Call MD / Call 911    Comments:  If you experience chest pain or shortness of breath, CALL 911 and be transported to the hospital emergency room.  If you develope a fever above 101 F, pus (white drainage) or increased drainage or redness at the wound, or calf pain, call your surgeon's office.   02/15/17 0845   02/15/17 0000  Diet - low sodium heart healthy     02/15/17 0845   02/15/17 0000  Constipation Prevention    Comments:  Drink plenty of fluids.  Prune juice may be helpful.  You may use a stool softener, such as Colace (over the counter) 100 mg twice a day.  Use MiraLax (over the counter) for constipation as needed.   02/15/17 0845   02/15/17 0000  Increase activity slowly as tolerated     02/15/17 0845   02/15/17 0000  Patient may shower    Comments:  You may shower without a dressing once there is no drainage.  Do not wash over the wound.  If drainage remains, cover wound with plastic wrap and then shower.   02/15/17 0845   02/15/17 0000  Driving restrictions    Comments:  No driving for 2 weeks   04/06/24 0845   02/15/17 0000  Follow the hip precautions as taught in Physical Therapy     02/15/17 0845   02/13/17 0000  aspirin EC 325 MG tablet  2 times daily     02/13/17 1441   02/13/17 0000  oxyCODONE-acetaminophen (ROXICET) 5-325 MG tablet  Every 4 hours PRN     02/13/17 1441   02/13/17 0000  tiZANidine (ZANAFLEX) 2 MG tablet  Every 6 hours PRN     02/13/17 1441      Diagnostic Studies: Dg Chest 2 View  Result Date: 02/06/2017 CLINICAL  DATA:  Pre-op chest09/10/2016 Rt arthroplasty rt hipGERDDiabetesHypertensionarthritis EXAM: CHEST  2 VIEW COMPARISON:  None. FINDINGS: The cardiac silhouette is normal in size. No mediastinal or hilar masses or evidence of adenopathy. The lungs are clear. No pleural effusion or pneumothorax. Skeletal structures are demineralized but intact.  IMPRESSION: No active cardiopulmonary disease. Electronically Signed   By: Amie Portland M.D.   On: 02/06/2017 16:01   Dg C-arm 1-60 Min  Result Date: 02/13/2017 CLINICAL DATA:  Status post right total hip joint prosthesis placement using anterior approach. EXAM: OPERATIVE right HIP (WITH PELVIS IF PERFORMED) 2 VIEWS TECHNIQUE: Fluoroscopic spot image(s) were submitted for interpretation post-operatively. Reported fluoro time is 36 seconds. COMPARISON:  None in PACs FINDINGS: The patient has undergone right total hip joint prosthesis placement. Radiographic positioning of the prosthetic components appears good. The interface with the native bone appears normal. IMPRESSION: No immediate postprocedure complication following right total hip joint prosthesis placement. There is a pre-existing left hip joint prosthesis. Electronically Signed   By: David  [place]and M.D.   On: 02/13/2017 14:05   Dg Hip Operative Unilat W Or W/o Pelvis Right  Result Date: 02/13/2017 CLINICAL DATA:  Status post right total hip joint prosthesis placement using anterior approach. EXAM: OPERATIVE right HIP (WITH PELVIS IF PERFORMED) 2 VIEWS TECHNIQUE: Fluoroscopic spot image(s) were submitted for interpretation post-operatively. Reported fluoro time is 36 seconds. COMPARISON:  None in PACs FINDINGS: The patient has undergone right total hip joint prosthesis placement. Radiographic positioning of the prosthetic components appears good. The interface with the native bone appears normal. IMPRESSION: No immediate postprocedure complication following right total hip joint prosthesis placement. There is a  pre-existing left hip joint prosthesis. Electronically Signed   By: David  [place] M.D.   On: 02/13/2017 14:05    Disposition: Final discharge disposition not confirmed  Discharge Instructions    Call MD / Call 911    Complete by:  As directed    If you experience chest pain or shortness of breath, CALL 911 and be transported to the hospital emergency room.  If you develope a fever above 101 F, pus (white drainage) or increased drainage or redness at the wound, or calf pain, call your surgeon's office.   Constipation Prevention    Complete by:  As directed    Drink plenty of fluids.  Prune juice may be helpful.  You may use a stool softener, such as Colace (over the counter) 100 mg twice a day.  Use MiraLax (over the counter) for constipation as needed.   Diet - low sodium heart healthy    Complete by:  As directed    Driving restrictions    Complete by:  As directed    No driving for 2 weeks   Follow the hip precautions as taught in Physical Therapy    Complete by:  As directed    Increase activity slowly as tolerated    Complete by:  As directed    Patient may shower    Complete by:  As directed    You may shower without a dressing once there is no drainage.  Do not wash over the wound.  If drainage remains, cover wound with plastic wrap and then shower.      Follow-up Information    Gean Birchwood, MD Follow up in 2 week(s).   Specialty:  Orthopedic Surgery Contact information: [address] [place] [address] [phone]            Signed: Vear Clock Manessa Buley R 02/15/2017, 8:45 AM

## 2017-02-15 NOTE — Social Work (Signed)
CSW  Met with patient multiple times to discuss the SNF process/placement/insurance auth. Pt wanted [facility], however [facility] does not take LOG. Juliann Pulse at [facility] is following up on accepting 5 day LOG approved by [employer].  CSW will continue to follow for disposition.  Elissa Hefty, LCSW Clinical Social Worker [phone]

## 2017-02-15 NOTE — Clinical Social Work Note (Signed)
Clinical Social Work Assessment  Patient Details  Name: Madison Myers MRN: [id number] Date of Birth: 15-Sep-1945  Date of referral:  02/15/17               Reason for consult:  Facility Placement                Permission sought to share information with:  Facility Art therapist granted to share information::  Yes, Verbal Permission Granted  Name::        Agency::  SNF  Relationship::     Contact Information:     Housing/Transportation Living arrangements for the past 2 months:  Apartment Source of Information:  Patient Patient Interpreter Needed:  Spanish Criminal Activity/Legal Involvement Pertinent to Current Situation/Hospitalization:  No - Comment as needed Significant Relationships:  Adult Children, Other Family Members Lives with:  Adult Children, Relatives Do you feel safe going back to the place where you live?  No Need for family participation in patient care:  No (Coment)  Care giving concerns:  CSW resided at home with family and was independent prior to impairment. Pt unable to return home and will need short term rehab. Patient willing to go to SNF.   Social Worker assessment / plan:  CSW met with patient at bedside with interpretation services to discuss CSW role and discharge disposition. CSW discussed SNF options/ placement/services/insurance auth at length with patient to ensure good understanding. Pt struggled to select SNF given the ratings on medicare sheet, location from home and availability of spanish speaking services. CSw obtained permission to send out offers.  FL2 complete. Passr obtained. Offers sent.  CSW will f/u for insurance.  Employment status:  Retired Nurse, adult PT Recommendations:  [place] Housing / Referral to community resources:  [place]  Patient/Family's Response to care:  Patient appreciative of CSW assistance and agreeable to SNF placement. No  issues or concerns at this time.  Patient/Family's Understanding of and Emotional Response to Diagnosis, Current Treatment, and Prognosis:  Patient has some understanding of diagnosis, current treatment and prognosis.Pt hopeful that she will get good care to improve impairment despite not being well familiar with the SNF's in area as she moved here from [place] about a year ago. No other issues or concerns.  Emotional Assessment Appearance:  Appears stated age (Cooperative and Friendly) Attitude/Demeanor/Rapport:   (Cooperative and Friendly) Affect (typically observed):  Accepting, Appropriate Orientation:  Oriented to Self, Oriented to Place, Oriented to  Time, Oriented to Situation Alcohol / Substance use:  Not Applicable Psych involvement (Current and /or in the community):  No (Comment)  Discharge Needs  Concerns to be addressed:  Care Coordination Readmission within the last 30 days:  No Current discharge risk:  Physical Impairment, Dependent with Mobility Barriers to Discharge:  No Barriers Identified   Normajean Baxter, LCSW 02/15/2017, 5:03 PM

## 2017-02-15 NOTE — Progress Notes (Signed)
PATIENT ID: Madison Myers  MRN: [id number]  DOB/AGE:  Oct 26, 1945 / 71 y.o.  2 Days Post-Op Procedure(s) (LRB): TOTAL HIP ARTHROPLASTY ANTERIOR APPROACH (Right)    PROGRESS NOTE Subjective: Patient is alert, oriented, no Nausea, no Vomiting, yes passing gas, . Taking PO well. Denies SOB, Chest or Calf Pain. Using Incentive Spirometer, PAS in place. Ambulate WBAT with pt walking 80 ft with therapy Patient reports pain as  mild .    Objective: Vital signs in last 24 hours: Vitals:   02/13/17 2300 02/14/17 1500 02/14/17 2132 02/15/17 0543  BP: (!) 123/59 (!) 100/53 (!) 100/51 (!) 123/54  Pulse: (!) 59 66 (!) 55 (!) 53  Resp: 16  17 18   Temp: (!) 97.5 F (36.4 C) (!) 97 F (36.1 C) 97.9 F (36.6 C) 97.7 F (36.5 C)  TempSrc: Oral Oral Oral Oral  SpO2: 95% 98% 99% 100%      Intake/Output from previous day: I/O last 3 completed shifts: In: 2109.2 [P.O.:730; I.V.:1379.2] Out: -    Intake/Output this shift: No intake/output data recorded.   LABORATORY DATA:  Recent Labs  02/13/17 1050 02/13/17 1509 02/14/17 0433 02/15/17 0430 02/15/17 0541  WBC  --   --  9.3 11.2*  --   HGB  --   --  9.9* 9.0*  --   HCT  --   --  29.9* 27.6*  --   PLT  --   --  203 196  --   GLUCAP 83 102*  --   --  112*    Examination: Neurologically intact Neurovascular intact Sensation intact distally Intact pulses distally Dorsiflexion/Plantar flexion intact Incision: dressing C/D/I and no drainage No cellulitis present Compartment soft} XR AP&Lat of hip shows well placed\fixed THA  Assessment:   2 Days Post-Op Procedure(s) (LRB): TOTAL HIP ARTHROPLASTY ANTERIOR APPROACH (Right) ADDITIONAL DIAGNOSIS:  Expected Acute Blood Loss Anemia, Hypertension  Plan: PT/OT WBAT, THA  DVT Prophylaxis: SCDx72 hrs, ASA 325 mg BID x 2 weeks  DISCHARGE PLAN: Skilled Nursing Facility/Rehab, today  DISCHARGE NEEDS: HHPT, Walker and 3-in-1 comode seat

## 2017-02-15 NOTE — Social Work (Addendum)
CSW received a few bed offers however none of these SNF's will take an Log for placement.  [facility] and they advised that they do not have staff to manage spanish speaking clients.  CSW will meet with patient to discuss bed offers. [place] in [name].  CSW faxed off clinicals for placement with a SNF selected. [facility] has [facility] however no auth received yet.   [facility] and they confirmed spanish speaking staff; [facility]. CSW left message for doctor.  Madison BreathPatricia Masa Lubin, LCSW Clinical Social Worker [phone]

## 2017-02-15 NOTE — Progress Notes (Signed)
Physical Therapy Treatment Patient Details Name: Madison Myers MRN: [id number] DOB: Nov 30, 1945 Today's Date: 02/15/2017    History of Present Illness Pt is a 71 y/o female s/p R THA, direct anterior approach. PMH includes anxiety, DM, HTN, and L THA.     PT Comments    Pt performed increased gait during session and required decreased assistance.  Pt remains to require increased time as her strength deficits are still apparent.  Pt will continue to benefit from short term skilled placement to improve strength and function before returning home.  Video interpreter used for session this am.     Follow Up Recommendations  SNF;Supervision/Assistance - 24 hour     Equipment Recommendations  None recommended by PT    Recommendations for Other Services       Precautions / Restrictions Precautions Precautions: None Precaution Comments: Reviewed THA exercise handout with pt.  Restrictions Weight Bearing Restrictions: Yes RLE Weight Bearing: Weight bearing as tolerated    Mobility  Bed Mobility Overal bed mobility: Needs Assistance Bed Mobility: Supine to Sit;Sit to Supine     Supine to sit: Supervision;HOB elevated Sit to supine: Supervision;HOB elevated   General bed mobility comments: Pt required increased time and use of rail but able to manage LEs into and OOB unasssisted.    Transfers Overall transfer level: Needs assistance Equipment used: Rolling walker (2 wheeled) Transfers: Sit to/from Stand Sit to Stand: Supervision         General transfer comment: Cues for hand placement to and from seated surface.  Pt stood edge of bed intially as she reports she has been dizzy in the past and this makes her feel safer.    Ambulation/Gait Ambulation/Gait assistance: Min guard Ambulation Distance (Feet): 110 Feet Assistive device: Rolling walker (2 wheeled) Gait Pattern/deviations: Decreased dorsiflexion - left;Trunk flexed;Antalgic;Step-through pattern   Gait velocity  interpretation: at or above normal speed for age/gender General Gait Details: Cues for heel strike on L and cues to push RW vs.  picking RW and putting RW down for more fluid gait.  Pt with increased gait speed.     Stairs            Wheelchair Mobility    Modified Rankin (Stroke Patients Only)       Balance Overall balance assessment: Needs assistance   Sitting balance-Leahy Scale: Good       Standing balance-Leahy Scale: Fair Standing balance comment: Reliant on RW for stability                             Cognition Arousal/Alertness: Awake/alert Behavior During Therapy: WFL for tasks assessed/performed Overall Cognitive Status: Within Functional Limits for tasks assessed                                        Exercises Total Joint Exercises Ankle Circles/Pumps: AROM;Both;20 reps;Supine Quad Sets: AROM;Right;10 reps;Supine Short Arc Quad: AROM;Right;10 reps;Supine Heel Slides: AROM;Right;10 reps;Supine Hip ABduction/ADduction: AROM;Right;10 reps;Supine    General Comments        Pertinent Vitals/Pain Pain Assessment: 0-10 Pain Score: 4  Pain Location: R hip  Pain Descriptors / Indicators: Aching;Operative site guarding Pain Intervention(s): Monitored during session;Repositioned    Home Living                      Prior Function  PT Goals (current goals can now be found in the care plan section) Acute Rehab PT Goals Patient Stated Goal: none stated Potential to Achieve Goals: Good Progress towards PT goals: Progressing toward goals    Frequency    7X/week      PT Plan Current plan remains appropriate    Co-evaluation              AM-PAC PT "6 Clicks" Daily Activity  Outcome Measure  Difficulty turning over in bed (including adjusting bedclothes, sheets and blankets)?: A Lot Difficulty moving from lying on back to sitting on the side of the bed? : A Lot Difficulty sitting down on  and standing up from a chair with arms (e.g., wheelchair, bedside commode, etc,.)?: A Little Help needed moving to and from a bed to chair (including a wheelchair)?: A Little Help needed walking in hospital room?: A Little Help needed climbing 3-5 steps with a railing? : A Little 6 Click Score: 16    End of Session Equipment Utilized During Treatment: Gait belt Activity Tolerance: Patient tolerated treatment well Patient left: with call bell/phone within reach;with family/visitor present;in bed Nurse Communication: Mobility status (requesting bath from CNA) PT Visit Diagnosis: Other abnormalities of gait and mobility (R26.89);Pain Pain - Right/Left: Right Pain - part of body: Hip     Time: 1610-9604 PT Time Calculation (min) (ACUTE ONLY): 27 min  Charges:  $Gait Training: 8-22 mins $Therapeutic Exercise: 8-22 mins                    G Codes:       [name], PTA pager [phone]    [name] 02/15/2017, 11:16 AM

## 2017-02-15 NOTE — NC FL2 (Signed)
[place] MEDICAID FL2 LEVEL OF CARE SCREENING TOOL     IDENTIFICATION  Patient Name: Madison Myers Birthdate: 06/28/1945 Sex: female Admission Date (Current Location): 02/13/2017  [facility] [place]   Facility and Address:  [facility] [place], [address], [place], [place] [address]      Provider Number: [id number]  Attending Physician Name and Address:  Gean Birchwoodowan, Frank, MD  Relative Name and Phone Number:  Quillian QuinceGodoy,Mayda Lien, [phone]  daughter    Current Level of Care: Hospital Recommended Level of Care: Skilled Nursing Facility Prior Approval Number:    Date Approved/Denied: 02/15/17 PASRR Number: [id number]  Discharge Plan: SNF    Current Diagnoses: Patient Active Problem List   Diagnosis Date Noted  . Primary osteoarthritis of right hip 02/13/2017  . Osteoarthritis of right hip 02/08/2017  . Preoperative cardiovascular examination 02/08/2017  . Essential hypertension 02/08/2017  . Mixed hyperlipidemia 02/08/2017    Orientation RESPIRATION BLADDER Height & Weight     Self, Time, Situation, Place  Normal Continent Weight:   Height:     BEHAVIORAL SYMPTOMS/MOOD NEUROLOGICAL BOWEL NUTRITION STATUS      Continent Diet (See DC Summary)  AMBULATORY STATUS COMMUNICATION OF NEEDS Skin   Limited Assist Verbally (Speaks Spanish) Surgical wounds (Silver Hydrofiber Dressing, Right Hip)                       Personal Care Assistance Level of Assistance              Functional Limitations Info  Speech     Speech Info:  (Speaks Spanish)    SPECIAL CARE FACTORS FREQUENCY  PT (By licensed PT)     PT Frequency: 7x week               Contractures      Additional Factors Info  Code Status, Allergies Code Status Info: Full code Allergies Info: CARBAMAZEPINE            Current Medications (02/15/2017):  This is the current hospital active medication list Current Facility-Administered Medications   Medication Dose Route Frequency Provider Last Rate Last Dose  . acetaminophen (TYLENOL) tablet 650 mg  650 mg Oral Q6H PRN Allena Katzhillips, Eric K, PA-C   650 mg at 02/14/17 2131   Or  . acetaminophen (TYLENOL) suppository 650 mg  650 mg Rectal Q6H PRN Allena KatzPhillips, Eric K, PA-C      . alum & mag hydroxide-simeth (MAALOX/MYLANTA) 200-200-20 MG/5ML suspension 30 mL  30 mL Oral Q4H PRN Allena KatzPhillips, Eric K, PA-C      . aspirin EC tablet 325 mg  325 mg Oral Q breakfast Allena Katzhillips, Eric K, PA-C   325 mg at 02/15/17 [id number]  . bisacodyl (DULCOLAX) EC tablet 5 mg  5 mg Oral Daily PRN Allena Katzhillips, Eric K, PA-C   5 mg at 02/14/17 1015  . celecoxib (CELEBREX) capsule 200 mg  200 mg Oral Q12H Allena Katzhillips, Eric K, PA-C   200 mg at 02/15/17 [id number]  . dextrose 5 % and 0.45 % NaCl with KCl 20 mEq/L infusion   Intravenous Continuous Allena Katzhillips, Eric K, PA-C   Stopped at 02/14/17 [phone]  . diphenhydrAMINE (BENADRYL) 12.5 MG/5ML elixir 12.5-25 mg  12.5-25 mg Oral Q4H PRN Allena KatzPhillips, Eric K, PA-C      . docusate sodium (COLACE) capsule 100 mg  100 mg Oral BID Allena Katzhillips, Eric K, PA-C   100 mg at 02/14/17 2131  . gabapentin (NEURONTIN) capsule 300 mg  300 mg Oral TID Allena Katz, PA-C   300 mg at 02/15/17 1610  . lisinopril (PRINIVIL,ZESTRIL) tablet 10 mg  10 mg Oral Daily Gean Birchwood, MD   10 mg at 02/15/17 9604   And  . hydrochlorothiazide (MICROZIDE) capsule 12.5 mg  12.5 mg Oral Daily Gean Birchwood, MD   12.5 mg at 02/15/17 5409  . HYDROmorphone (DILAUDID) injection 0.5 mg  0.5 mg Intravenous Q2H PRN Allena Katz, PA-C      . latanoprost (XALATAN) 0.005 % ophthalmic solution 1 drop  1 drop Both Eyes QHS Allena Katz, PA-C   1 drop at 02/14/17 2137  . menthol-cetylpyridinium (CEPACOL) lozenge 3 mg  1 lozenge Oral PRN Allena Katz, PA-C       Or  . phenol (CHLORASEPTIC) mouth spray 1 spray  1 spray Mouth/Throat PRN Allena Katz, PA-C      . methocarbamol (ROBAXIN) tablet 500 mg  500 mg Oral Q6H PRN Allena Katz, PA-C    500 mg at 02/14/17 2131   Or  . methocarbamol (ROBAXIN) 500 mg in dextrose 5 % 50 mL IVPB  500 mg Intravenous Q6H PRN Allena Katz, PA-C      . metoCLOPramide (REGLAN) tablet 5-10 mg  5-10 mg Oral Q8H PRN Allena Katz, PA-C       Or  . metoCLOPramide (REGLAN) injection 5-10 mg  5-10 mg Intravenous Q8H PRN Allena Katz, PA-C      . ondansetron [facility]) tablet 4 mg  4 mg Oral Q6H PRN Allena Katz, PA-C       Or  . ondansetron [facility]) injection 4 mg  4 mg Intravenous Q6H PRN Allena Katz, PA-C      . oxyCODONE (Oxy IR/ROXICODONE) immediate release tablet 5-10 mg  5-10 mg Oral Q3H PRN Allena Katz, PA-C   5 mg at 02/14/17 1015  . pantoprazole (PROTONIX) EC tablet 40 mg  40 mg Oral Daily Allena Katz, PA-C   40 mg at 02/15/17 8119  . polyvinyl alcohol (LIQUIFILM TEARS) 1.4 % ophthalmic solution 1 drop  1 drop Both Eyes Q4H PRN Allena Katz, PA-C      . senna-docusate (Senokot-S) tablet 1 tablet  1 tablet Oral QHS PRN Allena Katz, PA-C      . sodium phosphate (FLEET) 7-19 GM/118ML enema 1 enema  1 enema Rectal Once PRN Allena Katz, PA-C         Discharge Medications: Please see discharge summary for a list of discharge medications.  Relevant Imaging Results:  Relevant Lab Results:   Additional Information SS#: [id number] [place], LCSW

## 2017-02-16 LAB — CBC
HCT: 28.1 % — ABNORMAL LOW (ref 36.0–46.0)
Hemoglobin: 9 g/dL — ABNORMAL LOW (ref 12.0–15.0)
MCH: 31.1 pg (ref 26.0–34.0)
MCHC: 32 g/dL (ref 30.0–36.0)
MCV: 97.2 fL (ref 78.0–100.0)
Platelets: 200 10*3/uL (ref 150–400)
RBC: 2.89 MIL/uL — ABNORMAL LOW (ref 3.87–5.11)
RDW: 13.2 % (ref 11.5–15.5)
WBC: 10.5 10*3/uL (ref 4.0–10.5)

## 2017-02-16 NOTE — Progress Notes (Addendum)
LCSW is following for discharge planning. Aware of discharge  Call placed to [facility] to determine if patient can admit under LOG due to lack of insurance auth Message left and will await for call back. This was referral was started on Friday and continued today. Will follow up once call returned and continue assisting with placement.  11:17 AM Call returned from admissions. Admissions has run patients insurance and it is not active.  Card reviewed in chart and appears last card was 04/2016 which may mean she is not covered. She has medicaid however she would be required to remain in nursing facility for thirty days and release medicaid check for payment of nursing. Patient is also walking 148f documented by PT and MD.  [facility]. NO LOG will be authorized as insurance will not cover.  Plan: Will discuss with patient the option of going home with supervision and HH.  Will update team and CM of plans.  To follow.  02/16/2017  3:19 PM  Patient will go home as she does not qualify for SNF at this time as she is doing too well for insurance to cover. Met with patient with CM and RN along with interpreting services. Patient agreeable to go home. Reports her daughter will pick her up after she gets off work this evening. Voices no other concerns. CM will be arranging for home health and DME. DC today.  HLane Hacker MSW Clinical Social Work: SPrintmakerCoverage for :  [phone]

## 2017-02-16 NOTE — Progress Notes (Signed)
Provided discharge education/instruction to Pt and daughter with the use of a Engineer, structuralpanish translator, all questions and concerns addressed. Patient not in distress, discharged home accompanied by daughter.

## 2017-02-16 NOTE — Care Management Note (Signed)
Case Management Note  Patient Details  Name: Madison CommonMaria Myers MRN: [id number] Date of Birth: [birth year]  Subjective/Objective:                 Patient with order to discharge to Skilled Nursing Facility (SNF). SNF discharge facilitated through Clinical Social Work (CSW). Please refer to CSW notes for disposition plan and direct questions accordingly. [name] RN [employer]CM [phone].    Action/Plan:   Expected Discharge Date:  02/15/17               Expected Discharge Plan:  Skilled Nursing Facility  In-House Referral:  Clinical Social Work  Discharge planning Services  CM Consult  Post Acute Care Choice:    Choice offered to:     DME Arranged:    DME Agency:     HH Arranged:    HH Agency:     Status of Service:  Completed, signed off  If discussed at MicrosoftLong Length of [employer]Stay Meetings, dates discussed:    Additional Comments:  [name], RN 02/16/2017, 10:35 AM

## 2017-02-16 NOTE — Progress Notes (Signed)
PATIENT ID: Madison CommonMaria Myers  MRN: [id number]  DOB/AGE:  [birth year] / 70 y.o.  3 Days Post-Op Procedure(s) (LRB): TOTAL HIP ARTHROPLASTY ANTERIOR APPROACH (Right)    PROGRESS NOTE Subjective: Patient is alert, oriented, no Nausea, no Vomiting, yes passing gas, . Taking PO well. Denies SOB, Chest or Calf Pain. Using Incentive Spirometer, PAS in place. Ambulate WBAT with pt walking 110 ft Patient reports pain as  mild  .    Objective: Vital signs in last 24 hours: Vitals:   02/15/17 0543 02/15/17 1500 02/15/17 2110 02/16/17 0500  BP: (!) 123/54 (!) 122/54 (!) 115/54 (!) 93/40  Pulse: (!) 53 (!) 55 (!) 58 (!) 58  Resp: 18 18 18 17   Temp: 97.7 F (36.5 C) 97.7 F (36.5 C) 98.1 F (36.7 C) 98.8 F (37.1 C)  TempSrc: Oral Oral Oral Oral  SpO2: 100% 100% 99% 99%      Intake/Output from previous day: I/O last 3 completed shifts: In: 720 [P.O.:720] Out: -    Intake/Output this shift: No intake/output data recorded.   LABORATORY DATA:  Recent Labs  02/13/17 1050 02/13/17 1509  02/15/17 0430 02/15/17 0541 02/16/17 0359  WBC  --   --   < > 11.2*  --  10.5  HGB  --   --   < > 9.0*  --  9.0*  HCT  --   --   < > 27.6*  --  28.1*  PLT  --   --   < > 196  --  200  GLUCAP 83 102*  --   --  112*  --   < > = values in this interval not displayed.  Examination: Neurologically intact Neurovascular intact Sensation intact distally Intact pulses distally Dorsiflexion/Plantar flexion intact Incision: dressing C/D/I No cellulitis present Compartment soft} XR AP&Lat of hip shows well placed\fixed THA  Assessment:   3 Days Post-Op Procedure(s) (LRB): TOTAL HIP ARTHROPLASTY ANTERIOR APPROACH (Right) ADDITIONAL DIAGNOSIS:  Expected Acute Blood Loss Anemia, Hypertension  Plan: PT/OT WBAT, THA  DVT Prophylaxis: SCDx72 hrs, ASA 325 mg BID x 2 weeks  DISCHARGE PLAN: Skilled Nursing Facility/Rehab, when bed available  DISCHARGE NEEDS: HHPT, Walker and 3-in-1 comode seat

## 2017-02-16 NOTE — Care Management Note (Addendum)
Case Management Note  Patient Details  Name: Madison Myers MRN: [id number] Date of Birth: [birth year]  Subjective/Objective:    71 y.o. S/P R THA originally thought to need SNF now to have HHPT/HHOT. With the use of Translator for Spanish CSW/RNCM . Jermaine, rep [facility] is aware and will coordinate this care. Ordered DME, RW and 3n1               Action/Plan:CM will sign off for now but will be available should additional discharge needs arise or disposition change.    Expected Discharge Date:  02/15/17               Expected Discharge Plan:  Skilled Nursing Facility  In-House Referral:  Clinical Social Work  Discharge planning Services  CM Consult  Post Acute Care Choice:  Durable Medical Equipment, Home Health Choice offered to:  Patient  DME Arranged:  3-N-1, Walker rolling DME Agency:  [facility]  HH Arranged:  PT, OT HH Agency:  [facility]  Status of Service:  Completed, signed off  If discussed at Long Length of Stay Meetings, dates discussed:    Additional Comments:  Yvone NeuCrutchfield, Madison Printup M, RN 02/16/2017, 3:12 PM

## 2017-02-16 NOTE — Progress Notes (Signed)
Physical Therapy Treatment Patient Details Name: Madison Myers MRN: [id number] DOB: 1945/12/24 Today's Date: 02/16/2017    History of Present Illness Pt is a 71 y/o female s/p R THA, direct anterior approach. PMH includes anxiety, DM, HTN, and L THA.     PT Comments    Pt doing well - walking longer distances.  Pt with left foot drop walking.   Follow Up Recommendations  SNF;Supervision/Assistance - 24 hour     Equipment Recommendations  None recommended by PT    Recommendations for Other Services       Precautions / Restrictions Precautions Precautions: None Restrictions Weight Bearing Restrictions: Yes RLE Weight Bearing: Weight bearing as tolerated    Mobility  Bed Mobility                  Transfers Overall transfer level: Needs assistance Equipment used: Rolling walker (2 wheeled) Transfers: Sit to/from Stand Sit to Stand: Supervision         General transfer comment: pt denies any dizziness today  Ambulation/Gait Ambulation/Gait assistance: Min guard Ambulation Distance (Feet): 200 Feet Assistive device: Rolling walker (2 wheeled) Gait Pattern/deviations: Decreased dorsiflexion - left;Step-through pattern;Antalgic;Trunk flexed     General Gait Details: Cues for heel strike on L and cues to push RW vs.  picking RW and putting RW down for more fluid gait.  Pt with increased gait speed.     Stairs            Wheelchair Mobility    Modified Rankin (Stroke Patients Only)       Balance               Standing balance comment: pt stood and brushed her hair and teeth with no problems with balance                            Cognition Arousal/Alertness: Awake/alert Behavior During Therapy: WFL for tasks assessed/performed Overall Cognitive Status: Within Functional Limits for tasks assessed                                        Exercises Total Joint Exercises Ankle Circles/Pumps: AROM;Both;20  reps;Seated (cues for full ROM on left as able) Long Arc Quad: AROM;Both;15 reps    General Comments        Pertinent Vitals/Pain Pain Assessment: 0-10 Pain Score: 5  Pain Location: R hip  Pain Descriptors / Indicators: Aching;Operative site guarding Pain Intervention(s): Monitored during session;Repositioned    Home Living                      Prior Function            PT Goals (current goals can now be found in the care plan section) Progress towards PT goals: Progressing toward goals    Frequency    7X/week      PT Plan Current plan remains appropriate    Co-evaluation              AM-PAC PT "6 Clicks" Daily Activity  Outcome Measure  Difficulty turning over in bed (including adjusting bedclothes, sheets and blankets)?: A Little Difficulty moving from lying on back to sitting on the side of the bed? : A Little Difficulty sitting down on and standing up from a chair with arms (e.g., wheelchair, bedside commode, etc,.)?: A Little  Help needed moving to and from a bed to chair (including a wheelchair)?: A Little Help needed walking in hospital room?: A Little Help needed climbing 3-5 steps with a railing? : A Little 6 Click Score: 18    End of Session Equipment Utilized During Treatment: Gait belt Activity Tolerance: Patient tolerated treatment well Patient left: with call bell/phone within reach;in chair Nurse Communication: Mobility status PT Visit Diagnosis: Other abnormalities of gait and mobility (R26.89);Pain Pain - Right/Left: Right Pain - part of body: Hip     Time: 1610-9604 PT Time Calculation (min) (ACUTE ONLY): 20 min  Charges:  $Gait Training: 8-22 mins                    G Codes:       03-08-17   Ranae Palms, PT    Judson Roch 08-Mar-2017, 11:48 AM

## 2017-07-09 ENCOUNTER — Other Ambulatory Visit: Payer: Self-pay | Admitting: Internal Medicine

## 2017-07-09 DIAGNOSIS — Z1231 Encounter for screening mammogram for malignant neoplasm of breast: Secondary | ICD-10-CM

## 2017-07-30 ENCOUNTER — Ambulatory Visit
Admission: RE | Admit: 2017-07-30 | Discharge: 2017-07-30 | Disposition: A | Discharge status: Home / Self Care | Payer: Medicare HMO | Source: Ambulatory Visit | Attending: Internal Medicine | Admitting: Internal Medicine

## 2017-07-30 DIAGNOSIS — Z1231 Encounter for screening mammogram for malignant neoplasm of breast: Secondary | ICD-10-CM

## 2018-07-22 ENCOUNTER — Other Ambulatory Visit: Payer: Self-pay | Admitting: Internal Medicine

## 2018-07-22 DIAGNOSIS — Z1231 Encounter for screening mammogram for malignant neoplasm of breast: Secondary | ICD-10-CM

## 2018-08-18 ENCOUNTER — Ambulatory Visit
Admission: RE | Admit: 2018-08-18 | Discharge: 2018-08-18 | Disposition: A | Discharge status: Home / Self Care | Payer: Medicare HMO | Source: Ambulatory Visit | Attending: Internal Medicine | Admitting: Internal Medicine

## 2018-08-18 DIAGNOSIS — Z1231 Encounter for screening mammogram for malignant neoplasm of breast: Secondary | ICD-10-CM

## 2019-08-10 ENCOUNTER — Other Ambulatory Visit: Payer: Self-pay | Admitting: Internal Medicine

## 2019-08-10 DIAGNOSIS — Z1231 Encounter for screening mammogram for malignant neoplasm of breast: Secondary | ICD-10-CM

## 2019-09-09 ENCOUNTER — Ambulatory Visit
Admission: RE | Admit: 2019-09-09 | Discharge: 2019-09-09 | Disposition: A | Discharge status: Home / Self Care | Payer: Medicare HMO | Source: Ambulatory Visit | Attending: Internal Medicine | Admitting: Internal Medicine

## 2019-09-09 ENCOUNTER — Other Ambulatory Visit: Payer: Self-pay

## 2019-09-09 DIAGNOSIS — Z1231 Encounter for screening mammogram for malignant neoplasm of breast: Secondary | ICD-10-CM

## 2020-08-30 ENCOUNTER — Other Ambulatory Visit: Payer: Self-pay | Admitting: Internal Medicine

## 2020-08-30 DIAGNOSIS — Z1231 Encounter for screening mammogram for malignant neoplasm of breast: Secondary | ICD-10-CM

## 2020-10-21 ENCOUNTER — Ambulatory Visit: Payer: Medicare HMO

## 2020-12-16 ENCOUNTER — Ambulatory Visit: Payer: Medicare HMO

## 2021-02-06 ENCOUNTER — Ambulatory Visit
Admission: RE | Admit: 2021-02-06 | Discharge: 2021-02-06 | Disposition: A | Discharge status: Home / Self Care | Payer: Medicare HMO | Source: Ambulatory Visit | Attending: Internal Medicine | Admitting: Internal Medicine

## 2021-02-06 ENCOUNTER — Other Ambulatory Visit: Payer: Self-pay

## 2021-02-06 DIAGNOSIS — Z1231 Encounter for screening mammogram for malignant neoplasm of breast: Secondary | ICD-10-CM

## 2021-02-24 ENCOUNTER — Encounter: Payer: Self-pay | Admitting: Rehabilitative and Restorative Service Providers"

## 2021-02-24 ENCOUNTER — Other Ambulatory Visit: Payer: Self-pay

## 2021-02-24 ENCOUNTER — Ambulatory Visit: Payer: Medicare HMO | Attending: Orthopaedic Surgery | Admitting: Rehabilitative and Restorative Service Providers"

## 2021-02-24 DIAGNOSIS — M722 Plantar fascial fibromatosis: Secondary | ICD-10-CM | POA: Diagnosis not present

## 2021-02-24 DIAGNOSIS — M79672 Pain in left foot: Secondary | ICD-10-CM | POA: Insufficient documentation

## 2021-02-24 DIAGNOSIS — R262 Difficulty in walking, not elsewhere classified: Secondary | ICD-10-CM

## 2021-02-24 DIAGNOSIS — M79671 Pain in right foot: Secondary | ICD-10-CM | POA: Insufficient documentation

## 2021-02-24 DIAGNOSIS — M25671 Stiffness of right ankle, not elsewhere classified: Secondary | ICD-10-CM | POA: Diagnosis present

## 2021-02-24 DIAGNOSIS — M25672 Stiffness of left ankle, not elsewhere classified: Secondary | ICD-10-CM | POA: Diagnosis present

## 2021-02-24 DIAGNOSIS — M6281 Muscle weakness (generalized): Secondary | ICD-10-CM | POA: Insufficient documentation

## 2021-02-24 DIAGNOSIS — R600 Localized edema: Secondary | ICD-10-CM

## 2021-02-24 DIAGNOSIS — R252 Cramp and spasm: Secondary | ICD-10-CM | POA: Diagnosis present

## 2021-02-24 NOTE — Therapy (Signed)
[facility] [address] [place], [place], [address] Phone: [phone]   Fax:  [phone]  Physical Therapy Evaluation  Patient Details  Name: Madison Myers MRN: [id number] Date of Birth: January 09, 1946 Referring Provider (PT): Dr Dub Mikes   Encounter Date: 02/24/2021   PT End of Session - 02/24/21 0840     Visit Number 1    Number of Visits 16    Date for PT Re-Evaluation 04/14/21    PT Start Time 0800    PT Stop Time 0840    PT Time Calculation (min) 40 min    Activity Tolerance Patient tolerated treatment well    Behavior During Therapy [facility] for tasks assessed/performed             Past Medical History:  Diagnosis Date   Anxiety    takes medication as needed   Arthritis    Diabetes mellitus without complication (HCC)    controlled with no complications - is not currently taking any medications 02/06/17   GERD (gastroesophageal reflux disease)    High cholesterol    History of hiatal hernia    Hypertension     Past Surgical History:  Procedure Laterality Date   ABDOMINAL HYSTERECTOMY     BREAST BIOPSY Left    CATARACT EXTRACTION W/ INTRAOCULAR LENS  IMPLANT, BILATERAL     HEMORRHOID SURGERY     HIP SURGERY Left 2010   in [place]   JOINT REPLACEMENT Left 2011   hip   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    Subjective Assessment - 02/24/21 0758     Subjective Pt reports that a couple of months ago she started having foot pain with L greater than R.    Patient is accompained by: Family member;Interpreter    Pertinent History OA, osteoporosis    How long can you walk comfortably? pain always present    Patient Stated Goals To to be able to  walk, garden, cook, etc without increased pain.    Currently in Pain? Yes    Pain Score 6    10/10 at worst   Pain Location Foot    Pain Orientation Left;Right    Pain Descriptors / Indicators Sore    Pain Type Chronic pain                [facility] PT Assessment - 02/24/21 0001       Assessment   Medical Diagnosis L plantar faciitis/foot OA    Referring Provider (PT) Dr Dub Mikes    Hand Dominance Right    Next MD Visit 03/22/21    Prior Therapy no      Precautions   Precautions None      Restrictions   Weight Bearing Restrictions No      Balance Screen   Has the patient fallen in the past 6 months No    Has the patient had a decrease in activity level because of a fear of falling?  No    Is the patient reluctant to leave their home because of a fear of falling?  No      Home Tourist information centre manager residence    Living Arrangements  Alone    Type of Home House    Home Access Level entry    Home Layout One level      Prior Function   Level of Independence Independent;Requires assistive device for independence    Vocation On disability    Leisure gardening, walking      Cognition   Overall Cognitive Status Within Functional Limits for tasks assessed      Observation/Other Assessments   Focus on Therapeutic Outcomes (FOTO)  14%      ROM / Strength   AROM / PROM / Strength AROM;Strength      AROM   AROM Assessment Site Ankle    Right/Left Ankle Right;Left    Right Ankle Dorsiflexion -17    Right Ankle Plantar Flexion 50    Right Ankle Inversion 10    Right Ankle Eversion 10    Left Ankle Dorsiflexion -30    Left Ankle Plantar Flexion 30    Left Ankle Inversion 15    Left Ankle Eversion 10      Strength   Overall Strength Comments B ankle strength of 3+ to 4-/5                        Objective measurements completed on examination: See above findings.       [facility] Adult PT Treatment/Exercise - 02/24/21 0001        Exercises   Exercises Ankle      Ankle Exercises: Stretches   Gastroc Stretch 1 rep;20 seconds      Ankle Exercises: Seated   ABC's 1 rep    Ankle Circles/Pumps AROM;Both;5 reps                     PT Education - 02/24/21 0834     Education Details Pt provided with HEP    Person(s) Educated Patient    Methods Explanation;Demonstration;Handout    Comprehension Verbalized understanding;Returned demonstration              PT Short Term Goals - 02/24/21 0912       PT SHORT TERM GOAL #1   Title Pt will be independent with initial HEP.    Time 2    Period Weeks    Status New               PT Long Term Goals - 02/24/21 0913       PT LONG TERM GOAL #1   Title Pt will be independent with advanced HEP.    Time 8    Period Weeks    Status New      PT LONG TERM GOAL #2   Title Pt will increase B ankle strength to at least 4+/5 to allow her to garden.    Time 8    Period Weeks    Status New      PT LONG TERM GOAL #3   Title Pt will increase B ankle dorsiflexion to at least 0 degrees to allow for a more efficent gait pattern with her walking program.    Time 8    Period Weeks      PT LONG TERM GOAL #4   Title Pt will be able to perform B single leg stance of at least 10 seconds to decrease her risk of falling.    Time 8    Period Weeks    Status New  Plan - 02/24/21 0845     Clinical Impression Statement Pt is a 75 y.o. female referred to outpatient PT from Dr Dub Mikes secondary to L plantar fasciitis and foot OA. Pts PLOF was living alone and independent with her cane.  She reports to using her cane secondary to her history of B hip surgeries. Pt reports that her L foot is worse, but she has symptoms on both her R and L.  She presents with decreased ankle ROM, decreased ankle strength, difficulty walking, decreased balance, and difficulty performing some of her leisure activities.  She reports that she  is having difficulty performing activities that she enjoys, such as gardening and her walking program.  She states that she continues to perform them, but is having increased pain.  Pt would benefit from skilled PT to address her functinal impariments to allow her to be safer and perform her leisure activities without increased pain.    Personal Factors and Comorbidities Age;Comorbidity 1    Comorbidities Hx of B hip surgeries    Examination-Activity Limitations Stairs;Locomotion Level    Examination-Participation Restrictions Yard Work;Community Activity    Stability/Clinical Decision Making Stable/Uncomplicated    Clinical Decision Making Low    Rehab Potential Good    PT Frequency 2x / week    PT Duration 8 weeks    PT Treatment/Interventions ADLs/Self Care Home Management;Cryotherapy;Electrical Stimulation;Iontophoresis 4mg /ml Dexamethasone;Moist Heat;Ultrasound;Gait training;Stair training;Functional mobility training;Therapeutic activities;Therapeutic exercise;Balance training;Neuromuscular re-education;Patient/family education;Manual techniques;Passive range of motion;Dry needling;Taping;Vasopneumatic Device;Joint Manipulations    PT Next Visit Plan Assess and progress HEP, ROM/flexibility, manual if indicated for pain, strengthening    PT Home Exercise Plan Access Code: [email or web address]    Consulted and Agree with Plan of Care Patient             Patient will benefit from skilled therapeutic intervention in order to improve the following deficits and impairments:  Decreased range of motion, Difficulty walking, Increased muscle spasms, Pain, Decreased balance, Impaired flexibility, Decreased strength  Visit Diagnosis: Plantar fasciitis of left foot - Plan: PT plan of care cert/re-cert  Pain in left foot - Plan: PT plan of care cert/re-cert  Pain in right foot - Plan: PT plan of care cert/re-cert  Muscle weakness (generalized) - Plan: PT plan of care cert/re-cert  Difficulty in walking,  not elsewhere classified - Plan: PT plan of care cert/re-cert  Stiffness of left ankle, not elsewhere classified - Plan: PT plan of care cert/re-cert  Stiffness of right ankle, not elsewhere classified - Plan: PT plan of care cert/re-cert  Cramp and spasm - Plan: PT plan of care cert/re-cert  Localized edema - Plan: PT plan of care cert/re-cert     Problem List Patient Active Problem List   Diagnosis Date Noted   Primary osteoarthritis of right hip 02/13/2017   Osteoarthritis of right hip 02/08/2017   Preoperative cardiovascular examination 02/08/2017   Essential hypertension 02/08/2017   Mixed hyperlipidemia 02/08/2017    02/10/2017, PT, DPT 02/24/2021, 9:25 AM  [facility] [address] [place], [place], [place] Phone: [phone]   Fax:  [phone]  Name: Jola Critzer MRN: Linward Natal Date of Birth: Jan 17, 1946

## 2021-02-24 NOTE — Patient Instructions (Signed)
Access Code: [email or web address] URL: [email or web address] Date: 02/24/2021 Prepared by: Clydie Braun Catharine Kettlewell  Exercises Seated Ankle Pumps - 2 x daily - 7 x weekly - 2 sets - 10 reps Seated Ankle Alphabet - 2 x daily - 7 x weekly - 1 sets - 1 reps Seated Calf Stretch with Strap - 2 x daily - 7 x weekly - 1 sets - 3 reps - 20 sec hold Foot Roller Plantar Massage - 2 x daily - 7 x weekly - 2 sets - 10 reps

## 2021-02-28 ENCOUNTER — Ambulatory Visit: Payer: Medicare HMO | Admitting: Rehabilitative and Restorative Service Providers"

## 2021-02-28 ENCOUNTER — Other Ambulatory Visit: Payer: Self-pay

## 2021-02-28 DIAGNOSIS — M25672 Stiffness of left ankle, not elsewhere classified: Secondary | ICD-10-CM

## 2021-02-28 DIAGNOSIS — R262 Difficulty in walking, not elsewhere classified: Secondary | ICD-10-CM

## 2021-02-28 DIAGNOSIS — R600 Localized edema: Secondary | ICD-10-CM

## 2021-02-28 DIAGNOSIS — M722 Plantar fascial fibromatosis: Secondary | ICD-10-CM | POA: Diagnosis not present

## 2021-02-28 DIAGNOSIS — M25671 Stiffness of right ankle, not elsewhere classified: Secondary | ICD-10-CM

## 2021-02-28 DIAGNOSIS — M79671 Pain in right foot: Secondary | ICD-10-CM

## 2021-02-28 DIAGNOSIS — M6281 Muscle weakness (generalized): Secondary | ICD-10-CM

## 2021-02-28 DIAGNOSIS — M79672 Pain in left foot: Secondary | ICD-10-CM

## 2021-02-28 DIAGNOSIS — R252 Cramp and spasm: Secondary | ICD-10-CM

## 2021-02-28 NOTE — Therapy (Signed)
[facility] [address] [place], [place], [address] Phone: [phone]   Fax:  [phone]  Physical Therapy Treatment  Patient Details  Name: Roland Prine MRN: [id number] Date of Birth: [birth year] Referring Provider (PT): Dr Dub Mikes   Encounter Date: 02/28/2021   PT End of Session - 02/28/21 0813     Visit Number 2    Number of Visits 16    Date for PT Re-Evaluation 04/14/21    PT Start Time 0805    PT Stop Time 0845    PT Time Calculation (min) 40 min    Activity Tolerance Patient tolerated treatment well    Behavior During Therapy [facility] for tasks assessed/performed             Past Medical History:  Diagnosis Date   Anxiety    takes medication as needed   Arthritis    Diabetes mellitus without complication (HCC)    controlled with no complications - is not currently taking any medications 02/06/17   GERD (gastroesophageal reflux disease)    High cholesterol    History of hiatal hernia    Hypertension     Past Surgical History:  Procedure Laterality Date   ABDOMINAL HYSTERECTOMY     BREAST BIOPSY Left    CATARACT EXTRACTION W/ INTRAOCULAR LENS  IMPLANT, BILATERAL     HEMORRHOID SURGERY     HIP SURGERY Left 2010   in [place]   JOINT REPLACEMENT Left 2011   hip   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   Subjective Assessment - 02/28/21 0810     Subjective Pt states that she has been doing all her exercises since last visit.    Patient is accompained by: Interpreter    Pertinent History OA, osteoporosis    Patient Stated Goals To to be able to walk, garden, cook, etc without increased pain.    Currently in Pain? No/denies                 [facility] PT Assessment - 02/28/21 0001       AROM   Right Ankle Dorsiflexion 0    Left Ankle Dorsiflexion -12                           [facility] Adult PT Treatment/Exercise - 02/28/21 0001       Manual Therapy   Manual Therapy Soft tissue mobilization;Joint mobilization;Passive ROM    Manual therapy comments in sitting    Joint Mobilization to encourage L DF    Soft tissue mobilization To L soleus and gastroc    Passive ROM L ankle in all motions      Ankle Exercises: Aerobic   Recumbent Bike L1.0 x72min    Nustep L5 x4 min      Ankle Exercises: Standing   Heel Raises Both;20 reps    Other Standing Ankle Exercises --      Ankle Exercises: Seated   Ankle Circles/Pumps AAROM;Both;20 reps    Other Seated Ankle Exercises Red tband:  Inv, Ever, DF 2x10 B      Ankle Exercises: Stretches   Gastroc Stretch 2 reps;20 seconds   on  floor bar                      PT Short Term Goals - 02/28/21 0921       PT SHORT TERM GOAL #1   Title Pt will be independent with initial HEP.    Status Achieved               PT Long Term Goals - 02/24/21 0913       PT LONG TERM GOAL #1   Title Pt will be independent with advanced HEP.    Time 8    Period Weeks    Status New      PT LONG TERM GOAL #2   Title Pt will increase B ankle strength to at least 4+/5 to allow her to garden.    Time 8    Period Weeks    Status New      PT LONG TERM GOAL #3   Title Pt will increase B ankle dorsiflexion to at least 0 degrees to allow for a more efficent gait pattern with her walking program.    Time 8    Period Weeks      PT LONG TERM GOAL #4   Title Pt will be able to perform B single leg stance of at least 10 seconds to decrease her risk of falling.    Time 8    Period Weeks    Status New                   Plan - 02/28/21 5397     Clinical Impression Statement Pt tolerated TE well and without increased pain.  Pt reported increased relief  of tension following manual therapy.  She is increasing in her AROM and improving her gait pattern.  She continues to require skilled PT to progress towards goal related activities.    PT Treatment/Interventions ADLs/Self Care Home Management;Cryotherapy;Electrical Stimulation;Iontophoresis 4mg /ml Dexamethasone;Moist Heat;Ultrasound;Gait training;Stair training;Functional mobility training;Therapeutic activities;Therapeutic exercise;Balance training;Neuromuscular re-education;Patient/family education;Manual techniques;Passive range of motion;Dry needling;Taping;Vasopneumatic Device;Joint Manipulations    PT Next Visit Plan Assess and progress HEP, ROM/flexibility, manual if indicated for pain, strengthening    Consulted and Agree with Plan of Care Patient             Patient will benefit from skilled therapeutic intervention in order to improve the following deficits and impairments:  Decreased range of motion, Difficulty walking, Increased muscle spasms, Pain, Decreased balance, Impaired flexibility, Decreased strength  Visit Diagnosis: Plantar fasciitis of left foot  Pain in left foot  Pain in right foot  Muscle weakness (generalized)  Difficulty in walking, not elsewhere classified  Stiffness of left ankle, not elsewhere classified  Stiffness of right ankle, not elsewhere classified  Cramp and spasm  Localized edema     Problem List Patient Active Problem List   Diagnosis Date Noted   Primary osteoarthritis of right hip 02/13/2017   Osteoarthritis of right hip 02/08/2017   Preoperative cardiovascular examination 02/08/2017   Essential hypertension 02/08/2017   Mixed hyperlipidemia 02/08/2017    02/10/2017, PT, DPT 02/28/2021, 9:23 AM  [facility] [place] [facility] [address] [place], [place], [place] Phone: [phone]   Fax:  [phone]  Name: Klohe Lovering MRN: Linward Natal Date of Birth:  04-13-1946

## 2021-03-02 ENCOUNTER — Other Ambulatory Visit: Payer: Self-pay

## 2021-03-02 ENCOUNTER — Ambulatory Visit: Payer: Medicare HMO | Admitting: Rehabilitative and Restorative Service Providers"

## 2021-03-02 ENCOUNTER — Encounter: Payer: Self-pay | Admitting: Rehabilitative and Restorative Service Providers"

## 2021-03-02 DIAGNOSIS — M722 Plantar fascial fibromatosis: Secondary | ICD-10-CM

## 2021-03-02 DIAGNOSIS — M6281 Muscle weakness (generalized): Secondary | ICD-10-CM

## 2021-03-02 DIAGNOSIS — M25672 Stiffness of left ankle, not elsewhere classified: Secondary | ICD-10-CM

## 2021-03-02 DIAGNOSIS — M25671 Stiffness of right ankle, not elsewhere classified: Secondary | ICD-10-CM

## 2021-03-02 DIAGNOSIS — M79671 Pain in right foot: Secondary | ICD-10-CM

## 2021-03-02 DIAGNOSIS — R262 Difficulty in walking, not elsewhere classified: Secondary | ICD-10-CM

## 2021-03-02 DIAGNOSIS — R252 Cramp and spasm: Secondary | ICD-10-CM

## 2021-03-02 DIAGNOSIS — R600 Localized edema: Secondary | ICD-10-CM

## 2021-03-02 DIAGNOSIS — M79672 Pain in left foot: Secondary | ICD-10-CM

## 2021-03-02 NOTE — Therapy (Signed)
[facility] [address] [place], [place], [address] Phone: [phone]   Fax:  [phone]  Physical Therapy Treatment  Patient Details  Name: Madison Myers MRN: [id number] Date of Birth: [birth year] Referring Provider (PT): Dr Dub Mikes   Encounter Date: 03/02/2021   PT End of Session - 03/02/21 1018     Visit Number 3    Number of Visits 16    Date for PT Re-Evaluation 04/14/21    PT Start Time 1015    PT Stop Time 1055    PT Time Calculation (min) 40 min    Activity Tolerance Patient tolerated treatment well    Behavior During Therapy [facility] for tasks assessed/performed             Past Medical History:  Diagnosis Date   Anxiety    takes medication as needed   Arthritis    Diabetes mellitus without complication (HCC)    controlled with no complications - is not currently taking any medications 02/06/17   GERD (gastroesophageal reflux disease)    High cholesterol    History of hiatal hernia    Hypertension     Past Surgical History:  Procedure Laterality Date   ABDOMINAL HYSTERECTOMY     BREAST BIOPSY Left    CATARACT EXTRACTION W/ INTRAOCULAR LENS  IMPLANT, BILATERAL     HEMORRHOID SURGERY     HIP SURGERY Left 2010   in [place]   JOINT REPLACEMENT Left 2011   hip   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   Subjective Assessment - 03/02/21 1017     Subjective Pt reports feeling okay.    Patient Stated Goals To to be able to walk, garden, cook, etc without increased pain.    Currently in Pain? No/denies                               [facility] Adult PT Treatment/Exercise - 03/02/21 0001       Manual Therapy   Manual  Therapy Soft tissue mobilization;Joint mobilization;Passive ROM    Manual therapy comments in sitting    Joint Mobilization to encourage L DF    Soft tissue mobilization To L soleus and gastroc    Passive ROM L ankle in all motions      Ankle Exercises: Aerobic   Nustep L5 x74min      Ankle Exercises: Seated   Ankle Circles/Pumps AAROM;Both;20 reps    Other Seated Ankle Exercises Red tband:  Inv, Ever, DF 2x10 B    Other Seated Ankle Exercises Sit to stand on AirEx 2x10   requires UE     Ankle Exercises: Standing   BAPS Sitting;10 reps    Heel Raises Both;20 reps    Other Standing Ankle Exercises Alt toe taps on 6" step 2x10   require CGA     Ankle Exercises: Stretches   Gastroc Stretch 2 reps;20 seconds   on floor bar                      PT Short Term Goals - 02/28/21 0921       PT SHORT  TERM GOAL #1   Title Pt will be independent with initial HEP.    Status Achieved               PT Long Term Goals - 03/02/21 1112       PT LONG TERM GOAL #1   Title Pt will be independent with advanced HEP.    Status On-going      PT LONG TERM GOAL #2   Title Pt will increase B ankle strength to at least 4+/5 to allow her to garden.    Status On-going      PT LONG TERM GOAL #3   Title Pt will increase B ankle dorsiflexion to at least 0 degrees to allow for a more efficent gait pattern with her walking program.    Status On-going      PT LONG TERM GOAL #4   Title Pt will be able to perform B single leg stance of at least 10 seconds to decrease her risk of falling.    Status On-going                   Plan - 03/02/21 1108     Clinical Impression Statement Pt continues to progress with goal related activities.  She reports that she can tell that the exercises are helping and she is feeling better.  Pt is compliant with HEP.  She required SBA/CGA with standing activities for balance and alt toe tap on step secondary to decreased balance. She is  progressing with ROM activities.    PT Treatment/Interventions ADLs/Self Care Home Management;Cryotherapy;Electrical Stimulation;Iontophoresis 4mg /ml Dexamethasone;Moist Heat;Ultrasound;Gait training;Stair training;Functional mobility training;Therapeutic activities;Therapeutic exercise;Balance training;Neuromuscular re-education;Patient/family education;Manual techniques;Passive range of motion;Dry needling;Taping;Vasopneumatic Device;Joint Manipulations    PT Next Visit Plan Assess and progress HEP, ROM/flexibility, manual if indicated for pain, strengthening    Consulted and Agree with Plan of Care Patient             Patient will benefit from skilled therapeutic intervention in order to improve the following deficits and impairments:  Decreased range of motion, Difficulty walking, Increased muscle spasms, Pain, Decreased balance, Impaired flexibility, Decreased strength  Visit Diagnosis: Plantar fasciitis of left foot  Pain in left foot  Pain in right foot  Muscle weakness (generalized)  Difficulty in walking, not elsewhere classified  Stiffness of left ankle, not elsewhere classified  Stiffness of right ankle, not elsewhere classified  Cramp and spasm  Localized edema     Problem List Patient Active Problem List   Diagnosis Date Noted   Primary osteoarthritis of right hip 02/13/2017   Osteoarthritis of right hip 02/08/2017   Preoperative cardiovascular examination 02/08/2017   Essential hypertension 02/08/2017   Mixed hyperlipidemia 02/08/2017    02/10/2017, PT, DPT 03/02/2021, 11:13 AM  [facility] [address] [place], [place], [place] Phone: [phone]   Fax:  [phone]  Name: Madison Myers MRN: Linward Natal Date of Birth: 24-Mar-1946

## 2021-03-07 ENCOUNTER — Encounter: Payer: Self-pay | Admitting: Rehabilitative and Restorative Service Providers"

## 2021-03-07 ENCOUNTER — Ambulatory Visit: Payer: Medicare HMO | Admitting: Rehabilitative and Restorative Service Providers"

## 2021-03-07 ENCOUNTER — Other Ambulatory Visit: Payer: Self-pay

## 2021-03-07 DIAGNOSIS — M25672 Stiffness of left ankle, not elsewhere classified: Secondary | ICD-10-CM

## 2021-03-07 DIAGNOSIS — R252 Cramp and spasm: Secondary | ICD-10-CM

## 2021-03-07 DIAGNOSIS — M79671 Pain in right foot: Secondary | ICD-10-CM

## 2021-03-07 DIAGNOSIS — R262 Difficulty in walking, not elsewhere classified: Secondary | ICD-10-CM

## 2021-03-07 DIAGNOSIS — M722 Plantar fascial fibromatosis: Secondary | ICD-10-CM | POA: Diagnosis not present

## 2021-03-07 DIAGNOSIS — M79672 Pain in left foot: Secondary | ICD-10-CM

## 2021-03-07 DIAGNOSIS — M25671 Stiffness of right ankle, not elsewhere classified: Secondary | ICD-10-CM

## 2021-03-07 DIAGNOSIS — M6281 Muscle weakness (generalized): Secondary | ICD-10-CM

## 2021-03-07 NOTE — Therapy (Signed)
[place]. [place], [place], [address] Phone: [phone]   Fax:  [phone]  Physical Therapy Treatment  Patient Details  Name: Madison Myers MRN: [id number] Date of Birth: May 30, 1946 Referring Provider (PT): Dr Melony Overly   Encounter Date: 03/07/2021   PT End of Session - 03/07/21 0834     Visit Number 4    Number of Visits 16    Date for PT Re-Evaluation 04/14/21    PT Start Time 0830    PT Stop Time 0910    PT Time Calculation (min) 40 min    Activity Tolerance Patient tolerated treatment well    Behavior During Therapy [facility] for tasks assessed/performed             Past Medical History:  Diagnosis Date   Anxiety    takes medication as needed   Arthritis    Diabetes mellitus without complication ([place])    controlled with no complications - is not currently taking any medications 02/06/17   GERD (gastroesophageal reflux disease)    High cholesterol    History of hiatal hernia    Hypertension     Past Surgical History:  Procedure Laterality Date   ABDOMINAL HYSTERECTOMY     BREAST BIOPSY Left    CATARACT EXTRACTION W/ INTRAOCULAR LENS  IMPLANT, BILATERAL     HEMORRHOID SURGERY     HIP SURGERY Left 2010   in [place]   JOINT REPLACEMENT Left 2011   hip   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   Subjective Assessment - 03/07/21 0833     Subjective Pt denies pain but states that she is having some L calf tightness.    Patient Stated Goals To to be able to walk, garden, cook, etc without increased pain.    Currently in Pain? No/denies                [facility] PT Assessment - 03/07/21 0001       AROM   Left Ankle Dorsiflexion  -8   PROM to 0/neutral                          [facility] Adult PT Treatment/Exercise - 03/07/21 0001       High Level Balance   High Level Balance Comments Standing on AirEx and ball toss.  Standingon AirEx with narrow base of support.      Manual Therapy   Manual Therapy Soft tissue mobilization;Joint mobilization;Passive ROM    Manual therapy comments in sitting    Joint Mobilization to encourage L DF    Soft tissue mobilization To L soleus and gastroc and L plantar surface of foot.    Passive ROM L ankle in all motions      Ankle Exercises: Aerobic   Nustep L5 x65mn      Ankle Exercises: Standing   BAPS Standing;Level 2;10 reps   CW, fwd/back, R/L   Heel Raises Both;20 reps    Other Standing Ankle Exercises Standing 3 way hip with 2# x10B.  Mini squats x10 reps.  Lunging onto Bosu x10 each.      Ankle Exercises: Stretches   Gastroc  Stretch 2 reps;20 seconds      Ankle Exercises: Seated   Other Seated Ankle Exercises Sit to stand on AirEx x10 holding yellow wt ball.                       PT Short Term Goals - 02/28/21 1916       PT SHORT TERM GOAL #1   Title Pt will be independent with initial HEP.    Status Achieved               PT Long Term Goals - 03/07/21 6060       PT LONG TERM GOAL #1   Title Pt will be independent with advanced HEP.    Status On-going      PT LONG TERM GOAL #2   Title Pt will increase B ankle strength to at least 4+/5 to allow her to garden.    Status On-going      PT LONG TERM GOAL #3   Title Pt will increase B ankle dorsiflexion to at least 0 degrees to allow for a more efficent gait pattern with her walking program.    Status Partially Met      PT LONG TERM GOAL #4   Title Pt will be able to perform B single leg stance of at least 10 seconds to decrease her risk of falling.    Status On-going                   Plan - 03/07/21 0926     Clinical Impression Statement Pt continues to  make progress and improve with her ROM and strength.  With manual therapy and PROM was able to achieve neutral DF in L ankle.  She is improving in balance and required less assistance with sit to/from stand and was able to complete without UE. She states that she feels that she is getting much better and able to do more at home with improved motion of her L ankle.    PT Treatment/Interventions ADLs/Self Care Home Management;Cryotherapy;Electrical Stimulation;Iontophoresis 78m/ml Dexamethasone;Moist Heat;Ultrasound;Gait training;Stair training;Functional mobility training;Therapeutic activities;Therapeutic exercise;Balance training;Neuromuscular re-education;Patient/family education;Manual techniques;Passive range of motion;Dry needling;Taping;Vasopneumatic Device;Joint Manipulations    PT Next Visit Plan Assess and progress HEP, ROM/flexibility, manual if indicated for pain, strengthening    Consulted and Agree with Plan of Care Patient             Patient will benefit from skilled therapeutic intervention in order to improve the following deficits and impairments:  Decreased range of motion, Difficulty walking, Increased muscle spasms, Pain, Decreased balance, Impaired flexibility, Decreased strength  Visit Diagnosis: Plantar fasciitis of left foot  Pain in left foot  Pain in right foot  Muscle weakness (generalized)  Difficulty in walking, not elsewhere classified  Stiffness of left ankle, not elsewhere classified  Stiffness of right ankle, not elsewhere classified  Cramp and spasm     Problem List Patient Active Problem List   Diagnosis Date Noted   Primary osteoarthritis of right hip 02/13/2017   Osteoarthritis of right hip 02/08/2017   Preoperative cardiovascular examination 02/08/2017   Essential hypertension 02/08/2017   Mixed hyperlipidemia 02/08/2017    SJuel Burrow PT, DPT 03/07/2021, 9:29 AM  [place] [address]Phone: [phone]  Fax:  [phone] Name: Madison BenedettiMRN: [id number] of Birth: [birth year]

## 2021-03-09 ENCOUNTER — Encounter: Payer: Self-pay | Admitting: Physical Therapy

## 2021-03-09 ENCOUNTER — Other Ambulatory Visit: Payer: Self-pay

## 2021-03-09 ENCOUNTER — Ambulatory Visit: Payer: Medicare HMO | Admitting: Physical Therapy

## 2021-03-09 DIAGNOSIS — R262 Difficulty in walking, not elsewhere classified: Secondary | ICD-10-CM

## 2021-03-09 DIAGNOSIS — M6281 Muscle weakness (generalized): Secondary | ICD-10-CM

## 2021-03-09 DIAGNOSIS — M722 Plantar fascial fibromatosis: Secondary | ICD-10-CM

## 2021-03-09 DIAGNOSIS — M79672 Pain in left foot: Secondary | ICD-10-CM

## 2021-03-09 DIAGNOSIS — M79671 Pain in right foot: Secondary | ICD-10-CM

## 2021-03-09 NOTE — Therapy (Signed)
[facility] [address] [place], [place], [address] Phone: [phone]   Fax:  [phone]  Physical Therapy Treatment  Patient Details  Name: Madison Myers MRN: [id number] Date of Birth: 06/09/1946 Referring Provider (PT): Dr Christopher Adair   Encounter Date: 03/09/2021   PT End of Session - 03/09/21 0925     Visit Number 5    Date for PT Re-Evaluation 04/14/21    PT Start Time 0850    PT Stop Time 0926    PT Time Calculation (min) 36 min    Activity Tolerance Patient tolerated treatment well    Behavior During Therapy [facility] for tasks assessed/performed             Past Medical History:  Diagnosis Date   Anxiety    takes medication as needed   Arthritis    Diabetes mellitus without complication (HCC)    controlled with no complications - is not currently taking any medications 02/06/17   GERD (gastroesophageal reflux disease)    High cholesterol    History of hiatal hernia    Hypertension     Past Surgical History:  Procedure Laterality Date   ABDOMINAL HYSTERECTOMY     BREAST BIOPSY Left    CATARACT EXTRACTION W/ INTRAOCULAR LENS  IMPLANT, BILATERAL     HEMORRHOID SURGERY     HIP SURGERY Left 2010   in [place]   JOINT REPLACEMENT Left 2011   hip   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   Subjective Assessment - 03/09/21 0851     Subjective no pain a lot better with the therapy    Currently in Pain? No/denies                               [facility] Adult PT Treatment/Exercise - 03/09/21 0001       Ankle Exercises: Aerobic   Nustep L4 x6min      Ankle Exercises: Standing   Heel Raises Both;20 reps;2 seconds    Other  Standing Ankle Exercises resisted gait forward/backward x5 each    Other Standing Ankle Exercises 4in step up x10 each, ALt boc taps 6 in from airex 2x10      Ankle Exercises: Seated   Other Seated Ankle Exercises Sit to stand on AirEx 2x10      Ankle Exercises: Machines for Strengthening   Cybex Leg Press Leg curls 15lb 2x10. Leg ext 5lb 2x10      Ankle Exercises: Stretches   Gastroc Stretch 3 reps;10 seconds                       PT Short Term Goals - 02/28/21 0921       PT SHORT TERM GOAL #1   Title Pt will be independent with initial HEP.    Status Achieved               PT Long Term Goals - 03/07/21 0928       PT LONG TERM GOAL #1   Title Pt will be independent with advanced HEP.    Status On-going      PT   LONG TERM GOAL #2   Title Pt will increase B ankle strength to at least 4+/5 to allow her to garden.    Status On-going      PT LONG TERM GOAL #3   Title Pt will increase B ankle dorsiflexion to at least 0 degrees to allow for a more efficent gait pattern with her walking program.    Status Partially Met      PT LONG TERM GOAL #4   Title Pt will be able to perform B single leg stance of at least 10 seconds to decrease her risk of falling.    Status On-going                   Plan - 03/09/21 1610     Clinical Impression Statement Pt ~ 5 minutes late for today's session. Again pt continues to make progress with ROM and strength. some instability noted with resisted backwards walking and alt box taps standing on aires. LLE tends to externally rotate that she reports comes from a past surgery. Cue to hold contraction with heel raises.    Personal Factors and Comorbidities Age;Comorbidity 1    Comorbidities Hx of B hip surgeries    Examination-Activity Limitations Stairs;Locomotion Level    Examination-Participation Restrictions Yard Work;Community Activity    Stability/Clinical Decision Making Stable/Uncomplicated    Rehab Potential  Good    PT Frequency 2x / week    PT Duration 8 weeks    PT Treatment/Interventions ADLs/Self Care Home Management;Cryotherapy;Electrical Stimulation;Iontophoresis 75m/ml Dexamethasone;Moist Heat;Ultrasound;Gait training;Stair training;Functional mobility training;Therapeutic activities;Therapeutic exercise;Balance training;Neuromuscular re-education;Patient/family education;Manual techniques;Passive range of motion;Dry needling;Taping;Vasopneumatic Device;Joint Manipulations    PT Next Visit Plan ROM/flexibility, manual if indicated for pain, strengthening             Patient will benefit from skilled therapeutic intervention in order to improve the following deficits and impairments:  Decreased range of motion, Difficulty walking, Increased muscle spasms, Pain, Decreased balance, Impaired flexibility, Decreased strength  Visit Diagnosis: Pain in right foot  Muscle weakness (generalized)  Difficulty in walking, not elsewhere classified  Pain in left foot  Plantar fasciitis of left foot     Problem List Patient Active Problem List   Diagnosis Date Noted   Primary osteoarthritis of right hip 02/13/2017   Osteoarthritis of right hip 02/08/2017   Preoperative cardiovascular examination 02/08/2017   Essential hypertension 02/08/2017   Mixed hyperlipidemia 02/08/2017    RScot Jun PTA 03/09/2021, 9:30 AM  [place] [address]Phone: [phone]  Fax:  [phone] Name: Madison HenneMRN: [id number] of Birth: [birth year]

## 2021-03-14 ENCOUNTER — Other Ambulatory Visit: Payer: Self-pay

## 2021-03-14 ENCOUNTER — Ambulatory Visit: Payer: Medicare HMO | Attending: Orthopaedic Surgery | Admitting: Rehabilitative and Restorative Service Providers"

## 2021-03-14 ENCOUNTER — Encounter: Payer: Self-pay | Admitting: Rehabilitative and Restorative Service Providers"

## 2021-03-14 DIAGNOSIS — R252 Cramp and spasm: Secondary | ICD-10-CM | POA: Diagnosis present

## 2021-03-14 DIAGNOSIS — M25672 Stiffness of left ankle, not elsewhere classified: Secondary | ICD-10-CM | POA: Insufficient documentation

## 2021-03-14 DIAGNOSIS — M25671 Stiffness of right ankle, not elsewhere classified: Secondary | ICD-10-CM | POA: Insufficient documentation

## 2021-03-14 DIAGNOSIS — M79671 Pain in right foot: Secondary | ICD-10-CM | POA: Insufficient documentation

## 2021-03-14 DIAGNOSIS — M722 Plantar fascial fibromatosis: Secondary | ICD-10-CM | POA: Insufficient documentation

## 2021-03-14 DIAGNOSIS — R262 Difficulty in walking, not elsewhere classified: Secondary | ICD-10-CM | POA: Diagnosis present

## 2021-03-14 DIAGNOSIS — M79672 Pain in left foot: Secondary | ICD-10-CM | POA: Insufficient documentation

## 2021-03-14 DIAGNOSIS — M6281 Muscle weakness (generalized): Secondary | ICD-10-CM | POA: Insufficient documentation

## 2021-03-14 NOTE — Therapy (Signed)
[place]. [place], [place], [address] Phone: [phone]   Fax:  [phone]  Physical Therapy Treatment  Patient Details  Name: Madison Myers MRN: [id number] Date of Birth: 27-Sep-1945 Referring Provider (PT): Dr Madison Myers   Encounter Date: 03/14/2021   PT End of Session - 03/14/21 0947     Visit Number 6    Number of Visits 16    Date for PT Re-Evaluation 04/14/21    PT Start Time 0845    PT Stop Time 0925    PT Time Calculation (min) 40 min    Activity Tolerance Patient tolerated treatment well    Behavior During Therapy [facility] for tasks assessed/performed             Past Medical History:  Diagnosis Date   Anxiety    takes medication as needed   Arthritis    Diabetes mellitus without complication ([place])    controlled with no complications - is not currently taking any medications 02/06/17   GERD (gastroesophageal reflux disease)    High cholesterol    History of hiatal hernia    Hypertension     Past Surgical History:  Procedure Laterality Date   ABDOMINAL HYSTERECTOMY     BREAST BIOPSY Left    CATARACT EXTRACTION W/ INTRAOCULAR LENS  IMPLANT, BILATERAL     HEMORRHOID SURGERY     HIP SURGERY Left 2010   in [place]   JOINT REPLACEMENT Left 2011   hip   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   Subjective Assessment - 03/14/21 0946     Subjective Pt reports feeling better.    Patient is accompained by: Interpreter    Currently in Pain? No/denies                [facility] PT Assessment - 03/14/21 0001       AROM   Overall AROM Comments L DF PROM to neutral                           [facility] Adult PT  Treatment/Exercise - 03/14/21 0001       High Level Balance   High Level Balance Comments Standing on AirEx performing toe tap to 2 cones and back.  x10B      Manual Therapy   Manual Therapy Soft tissue mobilization;Joint mobilization;Passive ROM    Manual therapy comments in sitting    Joint Mobilization to encourage L DF    Soft tissue mobilization To L soleus and gastroc and L plantar surface of foot.    Passive ROM L ankle in all motions      Ankle Exercises: Aerobic   Nustep L4 x1mn      Ankle Exercises: Standing   Heel Raises Both;20 reps;2 seconds    Other Standing Ankle Exercises resisted gait 20# forward/backward x5 each    Other Standing Ankle Exercises 4in step up x10 each, ALt box taps 6 in from airex 2x10      Ankle Exercises: Seated   Other Seated Ankle Exercises Sit to stand on AirEx 2x10   cuing to not use BUE     Ankle Exercises:  Machines for Strengthening   Cybex Leg Press 20# 2x10, 20# heel raises.  Leg curls 15lb 2x10. Leg ext 5lb 2x10      Ankle Exercises: Stretches   Gastroc Stretch 3 reps;10 seconds                       PT Short Term Goals - 02/28/21 0921       PT SHORT TERM GOAL #1   Title Pt will be independent with initial HEP.    Status Achieved               PT Long Term Goals - 03/14/21 1009       PT LONG TERM GOAL #1   Title Pt will be independent with advanced HEP.    Status Partially Met      PT LONG TERM GOAL #3   Title Pt will increase B ankle dorsiflexion to at least 0 degrees to allow for a more efficent gait pattern with her walking program.    Status Partially Met      PT LONG TERM GOAL #4   Title Pt will be able to perform B single leg stance of at least 10 seconds to decrease her risk of falling.    Status On-going                   Plan - 03/14/21 0947     Clinical Impression Statement Pt continues to progress with improved ROM and increased balance.  Pt with difficulty with standing on  AirEx and performing functional activities and to taps.  She continues to have great attitude.  With manual therapy, pt is having decreased trigger points. She continues to report decreased use of SPC around home and only when going out for safety.    PT Treatment/Interventions ADLs/Self Care Home Management;Cryotherapy;Electrical Stimulation;Iontophoresis 3m/ml Dexamethasone;Moist Heat;Ultrasound;Gait training;Stair training;Functional mobility training;Therapeutic activities;Therapeutic exercise;Balance training;Neuromuscular re-education;Patient/family education;Manual techniques;Passive range of motion;Dry needling;Taping;Vasopneumatic Device;Joint Manipulations    PT Next Visit Plan ROM/flexibility, manual if indicated for pain, strengthening    Consulted and Agree with Plan of Care Patient             Patient will benefit from skilled therapeutic intervention in order to improve the following deficits and impairments:  Decreased range of motion, Difficulty walking, Increased muscle spasms, Pain, Decreased balance, Impaired flexibility, Decreased strength  Visit Diagnosis: Pain in right foot  Muscle weakness (generalized)  Difficulty in walking, not elsewhere classified  Pain in left foot  Plantar fasciitis of left foot  Stiffness of left ankle, not elsewhere classified  Stiffness of right ankle, not elsewhere classified  Cramp and spasm     Problem List Patient Active Problem List   Diagnosis Date Noted   Primary osteoarthritis of right hip 02/13/2017   Osteoarthritis of right hip 02/08/2017   Preoperative cardiovascular examination 02/08/2017   Essential hypertension 02/08/2017   Mixed hyperlipidemia 02/08/2017    Madison Myers PT, DPT 03/14/2021, 10:15 AM  [place] [address]Phone: [phone]  Fax:  [phone] Name: Madison TredwayMRN: [id number]  of Birth: [birth year]

## 2021-03-16 ENCOUNTER — Other Ambulatory Visit: Payer: Self-pay

## 2021-03-16 ENCOUNTER — Ambulatory Visit: Payer: Medicare HMO | Admitting: Rehabilitative and Restorative Service Providers"

## 2021-03-16 ENCOUNTER — Encounter: Payer: Self-pay | Admitting: Rehabilitative and Restorative Service Providers"

## 2021-03-16 DIAGNOSIS — M25671 Stiffness of right ankle, not elsewhere classified: Secondary | ICD-10-CM

## 2021-03-16 DIAGNOSIS — M79671 Pain in right foot: Secondary | ICD-10-CM

## 2021-03-16 DIAGNOSIS — R252 Cramp and spasm: Secondary | ICD-10-CM

## 2021-03-16 DIAGNOSIS — M722 Plantar fascial fibromatosis: Secondary | ICD-10-CM

## 2021-03-16 DIAGNOSIS — M6281 Muscle weakness (generalized): Secondary | ICD-10-CM

## 2021-03-16 DIAGNOSIS — M25672 Stiffness of left ankle, not elsewhere classified: Secondary | ICD-10-CM

## 2021-03-16 DIAGNOSIS — R262 Difficulty in walking, not elsewhere classified: Secondary | ICD-10-CM

## 2021-03-16 DIAGNOSIS — M79672 Pain in left foot: Secondary | ICD-10-CM

## 2021-03-16 NOTE — Therapy (Signed)
[name], [place], [address] Phone: [phone]   Fax:  [phone]  Physical Therapy Treatment  Patient Details  Name: Madison Myers MRN: [id number] Date of Birth: 1946/04/07 Referring Provider (PT): Dr Melony Overly   Encounter Date: 03/16/2021   PT End of Session - 03/16/21 1013     Visit Number 7    Number of Visits 16    Date for PT Re-Evaluation 04/14/21    PT Start Time 0930    PT Stop Time 1010    PT Time Calculation (min) 40 min    Activity Tolerance Patient tolerated treatment well    Behavior During Therapy [facility] for tasks assessed/performed             Past Medical History:  Diagnosis Date   Anxiety    takes medication as needed   Arthritis    Diabetes mellitus without complication ([place])    controlled with no complications - is not currently taking any medications 02/06/17   GERD (gastroesophageal reflux disease)    High cholesterol    History of hiatal hernia    Hypertension     Past Surgical History:  Procedure Laterality Date   ABDOMINAL HYSTERECTOMY     BREAST BIOPSY Left    CATARACT EXTRACTION W/ INTRAOCULAR LENS  IMPLANT, BILATERAL     HEMORRHOID SURGERY     HIP SURGERY Left 2010   in [place]   JOINT REPLACEMENT Left 2011   hip   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   Subjective Assessment - 03/16/21 1053     Subjective Pt reports some pain this AM, but it alleviated with ambulation around home.    Patient Stated Goals To to be able to walk, garden, cook, etc without increased pain.    Currently in Pain? No/denies                               [facility] Adult PT Treatment/Exercise -  03/16/21 0001       High Level Balance   High Level Balance Comments Standing on AirEx performing toe tap to 2 cones and back.  x10B      Manual Therapy   Manual Therapy Soft tissue mobilization;Joint mobilization;Passive ROM    Manual therapy comments in sitting    Joint Mobilization to encourage L DF    Soft tissue mobilization To L soleus and gastroc and L plantar surface of foot.    Passive ROM L ankle in all motions      Ankle Exercises: Aerobic   Nustep L5 x40mn      Ankle Exercises: Seated   Ankle Circles/Pumps AAROM;Both;20 reps    Other Seated Ankle Exercises Green tband:  Inv, Ever, DF 2x10 B    Other Seated Ankle Exercises Sit to stand on AirEx 2x10   without UE     Ankle Exercises: Standing   Heel Raises Both;20 reps;2 seconds    Other Standing Ankle Exercises 4in step up x10 each, ALt box taps 6 in from airex 2x10      Ankle Exercises: Stretches   Gastroc Stretch 3 reps;10 seconds  Ankle Exercises: Machines for Strengthening   Cybex Leg Press 20# 2x10, 20# heel raises.  Leg curls 15lb 2x10. Leg ext 5lb 2x10                       PT Short Term Goals - 02/28/21 0045       PT SHORT TERM GOAL #1   Title Pt will be independent with initial HEP.    Status Achieved               PT Long Term Goals - 03/16/21 1050       PT LONG TERM GOAL #1   Title Pt will be independent with advanced HEP.    Status Partially Met      PT LONG TERM GOAL #2   Title Pt will increase B ankle strength to at least 4+/5 to allow her to garden.    Status On-going      PT LONG TERM GOAL #3   Title Pt will increase B ankle dorsiflexion to at least 0 degrees to allow for a more efficent gait pattern with her walking program.    Status Partially Met                   Plan - 03/16/21 1020     Clinical Impression Statement Pt continues to make progress towards goal related activities.  She reports that she had some soreness in her L foot this morning,  but the pain went away with stretching and ROM exercises.  She tolerated change of ankle strengthening to green tband well.  Pt requires min cuing with standing gastroc stretch to maintain good posture.  Pt requires cuing with heel raises on leg press machine to maintain straight knees.    PT Treatment/Interventions ADLs/Self Care Home Management;Cryotherapy;Electrical Stimulation;Iontophoresis 70m/ml Dexamethasone;Moist Heat;Ultrasound;Gait training;Stair training;Functional mobility training;Therapeutic activities;Therapeutic exercise;Balance training;Neuromuscular re-education;Patient/family education;Manual techniques;Passive range of motion;Dry needling;Taping;Vasopneumatic Device;Joint Manipulations    PT Next Visit Plan ROM/flexibility, manual if indicated for pain, strengthening    Consulted and Agree with Plan of Care Patient             Patient will benefit from skilled therapeutic intervention in order to improve the following deficits and impairments:  Decreased range of motion, Difficulty walking, Increased muscle spasms, Pain, Decreased balance, Impaired flexibility, Decreased strength  Visit Diagnosis: Pain in right foot  Muscle weakness (generalized)  Difficulty in walking, not elsewhere classified  Pain in left foot  Plantar fasciitis of left foot  Stiffness of left ankle, not elsewhere classified  Stiffness of right ankle, not elsewhere classified  Cramp and spasm     Problem List Patient Active Problem List   Diagnosis Date Noted   Primary osteoarthritis of right hip 02/13/2017   Osteoarthritis of right hip 02/08/2017   Preoperative cardiovascular examination 02/08/2017   Essential hypertension 02/08/2017   Mixed hyperlipidemia 02/08/2017    SJuel Burrow PT, DPT 03/16/2021, 10:55 AM  [place] [address]Phone: [phone]  Fax:  [phone] Name: MLadelle TeodoroMRN: [id number] of Birth: 508/14/1947

## 2021-03-16 NOTE — Therapy (Deleted)
[place]. [place], [place], [address] Phone: [phone]   Fax:  [phone]  Physical Therapy Treatment  Patient Details  Name: Madison Myers MRN: [id number] Date of Birth: Mar 04, 1946 Referring Provider (PT): Dr Madison Myers   Encounter Date: 03/16/2021   PT End of Session - 03/16/21 1013     Visit Number 7    Number of Visits 16    Date for PT Re-Evaluation 04/14/21    PT Start Time 0930    PT Stop Time 1010    PT Time Calculation (min) 40 min    Activity Tolerance Patient tolerated treatment well    Behavior During Therapy [facility] for tasks assessed/performed             Past Medical History:  Diagnosis Date   Anxiety    takes medication as needed   Arthritis    Diabetes mellitus without complication ([place])    controlled with no complications - is not currently taking any medications 02/06/17   GERD (gastroesophageal reflux disease)    High cholesterol    History of hiatal hernia    Hypertension     Past Surgical History:  Procedure Laterality Date   ABDOMINAL HYSTERECTOMY     BREAST BIOPSY Left    CATARACT EXTRACTION W/ INTRAOCULAR LENS  IMPLANT, BILATERAL     HEMORRHOID SURGERY     HIP SURGERY Left 2010   in [place]   JOINT REPLACEMENT Left 2011   hip   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                      [facility] Adult PT Treatment/Exercise - 03/16/21 0001       High Level Balance   High Level Balance Comments Standing on AirEx performing toe tap to 2 cones and back.  x10B      Manual Therapy   Manual Therapy Soft tissue mobilization;Joint mobilization;Passive ROM    Manual therapy comments in sitting     Joint Mobilization to encourage L DF    Soft tissue mobilization To L soleus and gastroc and L plantar surface of foot.    Passive ROM L ankle in all motions      Ankle Exercises: Aerobic   Nustep L5 x61mn      Ankle Exercises: Seated   Ankle Circles/Pumps AAROM;Both;20 reps    Other Seated Ankle Exercises Green tband:  Inv, Ever, DF 2x10 B    Other Seated Ankle Exercises Sit to stand on AirEx 2x10   without UE     Ankle Exercises: Standing   Heel Raises Both;20 reps;2 seconds    Other Standing Ankle Exercises 4in step up x10 each, ALt box taps 6 in from airex 2x10      Ankle Exercises: Stretches   Gastroc Stretch 3 reps;10 seconds      Ankle Exercises: Machines for Strengthening   Cybex Leg Press 20# 2x10, 20# heel raises.  Leg curls 15lb 2x10. Leg ext 5lb 2x10                       PT Short Term Goals - 02/28/21 00272  PT SHORT TERM GOAL #1   Title Pt will be independent with initial HEP.    Status Achieved               PT Long Term Goals - 03/16/21 1050       PT LONG TERM GOAL #1   Title Pt will be independent with advanced HEP.    Status Partially Met      PT LONG TERM GOAL #2   Title Pt will increase B ankle strength to at least 4+/5 to allow her to garden.    Status On-going      PT LONG TERM GOAL #3   Title Pt will increase B ankle dorsiflexion to at least 0 degrees to allow for a more efficent gait pattern with her walking program.    Status Partially Met                   Plan - 03/16/21 1020     Clinical Impression Statement Pt continues to make progress towards goal related activities.  She reports that she had some soreness in her L foot this morning, but the pain went away with stretching and ROM exercises.  She tolerated change of ankle strengthening to green tband well.  Pt requires min cuing with standing gastroc stretch to maintain good posture.  Pt requires cuing with heel raises on leg press machine to maintain  straight knees.    PT Treatment/Interventions ADLs/Self Care Home Management;Cryotherapy;Electrical Stimulation;Iontophoresis 12m/ml Dexamethasone;Moist Heat;Ultrasound;Gait training;Stair training;Functional mobility training;Therapeutic activities;Therapeutic exercise;Balance training;Neuromuscular re-education;Patient/family education;Manual techniques;Passive range of motion;Dry needling;Taping;Vasopneumatic Device;Joint Manipulations    PT Next Visit Plan ROM/flexibility, manual if indicated for pain, strengthening    Consulted and Agree with Plan of Care Patient             Patient will benefit from skilled therapeutic intervention in order to improve the following deficits and impairments:  Decreased range of motion, Difficulty walking, Increased muscle spasms, Pain, Decreased balance, Impaired flexibility, Decreased strength  Visit Diagnosis: Pain in right foot  Muscle weakness (generalized)  Difficulty in walking, not elsewhere classified  Pain in left foot  Plantar fasciitis of left foot  Stiffness of left ankle, not elsewhere classified  Stiffness of right ankle, not elsewhere classified  Cramp and spasm     Problem List Patient Active Problem List   Diagnosis Date Noted   Primary osteoarthritis of right hip 02/13/2017   Osteoarthritis of right hip 02/08/2017   Preoperative cardiovascular examination 02/08/2017   Essential hypertension 02/08/2017   Mixed hyperlipidemia 02/08/2017    Madison Myers PT, DPT 03/16/2021, 10:51 AM  Madison Myers [id number]: [phone]  Fax:  [phone] Name: MRoshanda BalazsMRN: [id number] of Birth: 5Oct 20, 1947

## 2021-03-21 ENCOUNTER — Other Ambulatory Visit: Payer: Self-pay

## 2021-03-21 ENCOUNTER — Encounter: Payer: Self-pay | Admitting: Physical Therapy

## 2021-03-21 ENCOUNTER — Ambulatory Visit: Payer: Medicare HMO | Admitting: Physical Therapy

## 2021-03-21 DIAGNOSIS — M79671 Pain in right foot: Secondary | ICD-10-CM

## 2021-03-21 DIAGNOSIS — M79672 Pain in left foot: Secondary | ICD-10-CM

## 2021-03-21 DIAGNOSIS — R262 Difficulty in walking, not elsewhere classified: Secondary | ICD-10-CM

## 2021-03-21 NOTE — Therapy (Signed)
[place]. [place], [place], [address] Phone: [phone]   Fax:  [phone]  Physical Therapy Treatment  Patient Details  Name: Madison Myers MRN: [id number] Date of Birth: 03-Jan-1946 Referring Provider (PT): Dr Melony Overly   Encounter Date: 03/21/2021   PT End of Session - 03/21/21 0926     Visit Number 8    Number of Visits 16    Date for PT Re-Evaluation 04/14/21    PT Start Time 0845    PT Stop Time 0928    PT Time Calculation (min) 43 min    Activity Tolerance Patient tolerated treatment well    Behavior During Therapy [facility] for tasks assessed/performed             Past Medical History:  Diagnosis Date   Anxiety    takes medication as needed   Arthritis    Diabetes mellitus without complication ([place])    controlled with no complications - is not currently taking any medications 02/06/17   GERD (gastroesophageal reflux disease)    High cholesterol    History of hiatal hernia    Hypertension     Past Surgical History:  Procedure Laterality Date   ABDOMINAL HYSTERECTOMY     BREAST BIOPSY Left    CATARACT EXTRACTION W/ INTRAOCULAR LENS  IMPLANT, BILATERAL     HEMORRHOID SURGERY     HIP SURGERY Left 2010   in [place]   JOINT REPLACEMENT Left 2011   hip   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   Subjective Assessment - 03/21/21 0847     Subjective "Better "    Patient is accompained by: Interpreter    Currently in Pain? No/denies                               [facility] Adult PT Treatment/Exercise - 03/21/21 0001       Ankle Exercises: Seated   Other Seated Ankle Exercises Green tband:  Inv, Ever, DF, PF x10  B      Ankle Exercises: Aerobic   Nustep L4 x29mn      Ankle Exercises: Standing   Heel Raises Both;20 reps;2 seconds   black bar   Other Standing Ankle Exercises Resisted side steps 10lb x5 each, Sit to stant 2x10 LE on airex    Other Standing Ankle Exercises 6in step up x10 each, ALt box taps 8 in from airex 2x10                       PT Short Term Goals - 02/28/21 00938      PT SHORT TERM GOAL #1   Title Pt will be independent with initial HEP.    Status Achieved               PT Long Term Goals - 03/16/21 1050       PT LONG TERM GOAL #1   Title Pt will be independent with advanced HEP.    Status Partially Met      PT LONG TERM GOAL #2   Title Pt will increase B ankle strength to  at least 4+/5 to allow her to garden.    Status On-going      PT LONG TERM GOAL #3   Title Pt will increase B ankle dorsiflexion to at least 0 degrees to allow for a more efficent gait pattern with her walking program.    Status Partially Met                   Plan - 03/21/21 0926     Clinical Impression Statement Pt reports improvement overall. Added resisted side step without issue. Some LE weakness and instability with step ups using higher box. This instability was also present with alt taps from airex using higher box. No reports of pain Tactile cu needed with heel raises to prevent posterior leaning. Limited ROM with resisted ankle PF and eversion.    Personal Factors and Comorbidities Age;Comorbidity 1    Comorbidities Hx of B hip surgeries    Examination-Activity Limitations Stairs;Locomotion Level    Examination-Participation Restrictions Yard Work;Community Activity    Stability/Clinical Decision Making Stable/Uncomplicated    Rehab Potential Good    PT Frequency 2x / week    PT Treatment/Interventions ADLs/Self Care Home Management;Cryotherapy;Electrical Stimulation;Iontophoresis 46m/ml Dexamethasone;Moist Heat;Ultrasound;Gait training;Stair  training;Functional mobility training;Therapeutic activities;Therapeutic exercise;Balance training;Neuromuscular re-education;Patient/family education;Manual techniques;Passive range of motion;Dry needling;Taping;Vasopneumatic Device;Joint Manipulations    PT Next Visit Plan ROM/flexibility, manual if indicated for pain, strengthening             Patient will benefit from skilled therapeutic intervention in order to improve the following deficits and impairments:  Decreased range of motion, Difficulty walking, Increased muscle spasms, Pain, Decreased balance, Impaired flexibility, Decreased strength  Visit Diagnosis: Difficulty in walking, not elsewhere classified  Pain in left foot  Pain in right foot     Problem List Patient Active Problem List   Diagnosis Date Noted   Primary osteoarthritis of right hip 02/13/2017   Osteoarthritis of right hip 02/08/2017   Preoperative cardiovascular examination 02/08/2017   Essential hypertension 02/08/2017   Mixed hyperlipidemia 02/08/2017    RScot Jun PTA 03/21/2021, 9:31 AM  [place] [address]Phone: [phone]  Fax:  [phone] Name: Madison BrociousMRN: [id number] of Birth: [birth year]

## 2021-03-23 ENCOUNTER — Other Ambulatory Visit: Payer: Self-pay

## 2021-03-23 ENCOUNTER — Encounter: Payer: Self-pay | Admitting: Physical Therapy

## 2021-03-23 ENCOUNTER — Ambulatory Visit: Payer: Medicare HMO | Admitting: Physical Therapy

## 2021-03-23 DIAGNOSIS — M6281 Muscle weakness (generalized): Secondary | ICD-10-CM

## 2021-03-23 DIAGNOSIS — M79671 Pain in right foot: Secondary | ICD-10-CM | POA: Diagnosis not present

## 2021-03-23 DIAGNOSIS — M79672 Pain in left foot: Secondary | ICD-10-CM

## 2021-03-23 DIAGNOSIS — R262 Difficulty in walking, not elsewhere classified: Secondary | ICD-10-CM

## 2021-03-23 NOTE — Therapy (Signed)
[place]. [place], [place], [address] Phone: [phone]   Fax:  [phone]  Physical Therapy Treatment  Patient Details  Name: Madison Myers MRN: [id number] Date of Birth: [birth year] Referring Provider (PT): Dr Melony Overly   Encounter Date: 03/23/2021   PT End of Session - 03/23/21 0926     Visit Number 9    Date for PT Re-Evaluation 04/14/21    PT Start Time 0845    PT Stop Time 0927    PT Time Calculation (min) 42 min    Activity Tolerance Patient tolerated treatment well    Behavior During Therapy [facility] for tasks assessed/performed             Past Medical History:  Diagnosis Date   Anxiety    takes medication as needed   Arthritis    Diabetes mellitus without complication ([place])    controlled with no complications - is not currently taking any medications 02/06/17   GERD (gastroesophageal reflux disease)    High cholesterol    History of hiatal hernia    Hypertension     Past Surgical History:  Procedure Laterality Date   ABDOMINAL HYSTERECTOMY     BREAST BIOPSY Left    CATARACT EXTRACTION W/ INTRAOCULAR LENS  IMPLANT, BILATERAL     HEMORRHOID SURGERY     HIP SURGERY Left 2010   in [place]   JOINT REPLACEMENT Left 2011   hip   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   Subjective Assessment - 03/23/21 0846     Subjective "Very Good" MD was pleased yesterday, wants to continues therapy    Currently in Pain? No/denies                               [facility] Adult PT Treatment/Exercise - 03/23/21 0001       Ankle Exercises: Aerobic   Nustep L4 x 5 min      Ankle Exercises: Standing   Heel Raises Both;20  reps;2 seconds    Other Standing Ankle Exercises Resisted side steps 20lb x5 each, Sit to stand 2x10 LE on airex    Other Standing Ankle Exercises 6in step up from Airex 2x10 each      Ankle Exercises: Machines for Strengthening   Cybex Leg Press 20# 2x10.  Leg curls 15lb 2x10. Leg ext 5lb 2x10                       PT Short Term Goals - 02/28/21 8850       PT SHORT TERM GOAL #1   Title Pt will be independent with initial HEP.    Status Achieved               PT Long Term Goals - 03/16/21 1050       PT LONG TERM GOAL #1   Title Pt will be independent with advanced HEP.    Status Partially Met      PT LONG TERM GOAL #2   Title Pt will increase B ankle strength to at least 4+/5 to allow her to garden.  Status On-going      PT LONG TERM GOAL #3   Title Pt will increase B ankle dorsiflexion to at least 0 degrees to allow for a more efficent gait pattern with her walking program.    Status Partially Met                   Plan - 03/23/21 0927     Clinical Impression Statement All interventions completed well. Pt has some instability with step up from airex pad. Increase resistance tolerated with resisted side step, cue needed to avoid narrow base of support. No issues with machine level strengthening. Cue not to allow LE to push against mat table with sit to stands.    Personal Factors and Comorbidities Age;Comorbidity 1    Comorbidities Hx of B hip surgeries    Examination-Activity Limitations Stairs;Locomotion Level    Examination-Participation Restrictions Yard Work;Community Activity    Stability/Clinical Decision Making Stable/Uncomplicated    Rehab Potential Good    PT Frequency 2x / week    PT Duration 8 weeks    PT Treatment/Interventions ADLs/Self Care Home Management;Cryotherapy;Electrical Stimulation;Iontophoresis 62m/ml Dexamethasone;Moist Heat;Ultrasound;Gait training;Stair training;Functional mobility training;Therapeutic  activities;Therapeutic exercise;Balance training;Neuromuscular re-education;Patient/family education;Manual techniques;Passive range of motion;Dry needling;Taping;Vasopneumatic Device;Joint Manipulations    PT Next Visit Plan ROM/flexibility, manual if indicated for pain, strengthening             Patient will benefit from skilled therapeutic intervention in order to improve the following deficits and impairments:  Decreased range of motion, Difficulty walking, Increased muscle spasms, Pain, Decreased balance, Impaired flexibility, Decreased strength  Visit Diagnosis: Pain in left foot  Pain in right foot  Muscle weakness (generalized)  Difficulty in walking, not elsewhere classified     Problem List Patient Active Problem List   Diagnosis Date Noted   Primary osteoarthritis of right hip 02/13/2017   Osteoarthritis of right hip 02/08/2017   Preoperative cardiovascular examination 02/08/2017   Essential hypertension 02/08/2017   Mixed hyperlipidemia 02/08/2017    RScot Jun PTA 03/23/2021, 9:29 AM  [place] [address]Phone: [phone]  Fax:  [phone] Name: MMaclovia UherMRN: [id number] of Birth: [birth year]

## 2021-03-28 ENCOUNTER — Other Ambulatory Visit: Payer: Self-pay

## 2021-03-28 ENCOUNTER — Ambulatory Visit: Payer: Medicare HMO | Admitting: Physical Therapy

## 2021-03-28 DIAGNOSIS — M25672 Stiffness of left ankle, not elsewhere classified: Secondary | ICD-10-CM

## 2021-03-28 DIAGNOSIS — R262 Difficulty in walking, not elsewhere classified: Secondary | ICD-10-CM

## 2021-03-28 DIAGNOSIS — M79672 Pain in left foot: Secondary | ICD-10-CM

## 2021-03-28 DIAGNOSIS — M25671 Stiffness of right ankle, not elsewhere classified: Secondary | ICD-10-CM

## 2021-03-28 DIAGNOSIS — R252 Cramp and spasm: Secondary | ICD-10-CM

## 2021-03-28 DIAGNOSIS — M79671 Pain in right foot: Secondary | ICD-10-CM | POA: Diagnosis not present

## 2021-03-28 DIAGNOSIS — M722 Plantar fascial fibromatosis: Secondary | ICD-10-CM

## 2021-03-28 DIAGNOSIS — M6281 Muscle weakness (generalized): Secondary | ICD-10-CM

## 2021-03-28 NOTE — Therapy (Signed)
[place]. [place], [place], [address] Phone: [phone]   Fax:  [phone]  Physical Therapy Treatment  Patient Details  Name: Madison Myers MRN: [id number] Date of Birth: 1945-10-22 Referring Provider (PT): Dr Melony Overly   Encounter Date: 03/28/2021   PT End of Session - 03/28/21 0839     Visit Number 10    Number of Visits 16    Date for PT Re-Evaluation 04/14/21    PT Start Time 0830    PT Stop Time 0915    PT Time Calculation (min) 45 min    Activity Tolerance Patient tolerated treatment well    Behavior During Therapy [facility] for tasks assessed/performed             Past Medical History:  Diagnosis Date   Anxiety    takes medication as needed   Arthritis    Diabetes mellitus without complication ([place])    controlled with no complications - is not currently taking any medications 02/06/17   GERD (gastroesophageal reflux disease)    High cholesterol    History of hiatal hernia    Hypertension     Past Surgical History:  Procedure Laterality Date   ABDOMINAL HYSTERECTOMY     BREAST BIOPSY Left    CATARACT EXTRACTION W/ INTRAOCULAR LENS  IMPLANT, BILATERAL     HEMORRHOID SURGERY     HIP SURGERY Left 2010   in [place]   JOINT REPLACEMENT Left 2011   hip   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   Subjective Assessment - 03/28/21 0834     Subjective Pt reports she's doing good. She's doing exercises for balance. She's also doing her calf exercises.    Patient is accompained by: Interpreter    Pertinent History OA, osteoporosis    How long can you walk comfortably? pain always present    Patient Stated Goals To to be able to walk,  garden, cook, etc without increased pain.    Currently in Pain? No/denies                [facility] PT Assessment - 03/28/21 0001       Assessment   Medical Diagnosis L plantar faciitis/foot OA    Referring Provider (PT) Dr Melony Overly    Prior Therapy no      AROM   Right Ankle Dorsiflexion 5    Left Ankle Dorsiflexion -5                           [facility] Adult PT Treatment/Exercise - 03/28/21 0001       Manual Therapy   Joint Mobilization talocrural & subtalar grade III mobs for L DF; standing joint mobilization with movement on step for DF      Ankle Exercises: Aerobic   Nustep L5 x 6 min      Ankle Exercises: Supine   Other Supine Ankle Exercises Ankle pumps x10      Ankle Exercises: Stretches   Soleus Stretch 30 seconds    Gastroc Stretch 30 seconds   R&L     Ankle Exercises: Standing   SLS On airex: hip abduction red  tband 2x10; hip ext red tband 2x10    Heel Raises Both;20 reps;2 seconds   heel/toe raises; heel raises off step 2x10   Other Standing Ankle Exercises tandem stance 2x30 sec    Other Standing Ankle Exercises 6in step up from Airex 2x10 each                       PT Short Term Goals - 02/28/21 6629       PT SHORT TERM GOAL #1   Title Pt will be independent with initial HEP.    Status Achieved               PT Long Term Goals - 03/28/21 0924       PT LONG TERM GOAL #1   Title Pt will be independent with advanced HEP.    Status Partially Met      PT LONG TERM GOAL #2   Title Pt will increase B ankle strength to at least 4+/5 to allow her to garden.    Status On-going      PT LONG TERM GOAL #3   Title Pt will increase B ankle dorsiflexion to at least 0 degrees to allow for a more efficent gait pattern with her walking program.    Status Partially Met      PT LONG TERM GOAL #4   Title Pt will be able to perform B single leg stance of at least 10 seconds to decrease her risk of falling.    Time 8     Period Weeks    Status On-going                   Plan - 03/28/21 0910     Clinical Impression Statement Pt continues to have difficulty with stabilization on L LE. Continued to work on improving L ankle AROM -- joint appears hypomobile likely due to increased L hip external rotation from previous hip surgery leading to L foot eversion in standing; addressed with joint mobilizations. Continued to strengthen hips and ankles in standing.    Personal Factors and Comorbidities Age;Comorbidity 1    Comorbidities Hx of B hip surgeries    Examination-Activity Limitations Stairs;Locomotion Level    Examination-Participation Restrictions Yard Work;Community Activity    Stability/Clinical Decision Making Stable/Uncomplicated    Rehab Potential Good    PT Frequency 2x / week    PT Duration 8 weeks    PT Treatment/Interventions ADLs/Self Care Home Management;Cryotherapy;Electrical Stimulation;Iontophoresis 68m/ml Dexamethasone;Moist Heat;Ultrasound;Gait training;Stair training;Functional mobility training;Therapeutic activities;Therapeutic exercise;Balance training;Neuromuscular re-education;Patient/family education;Manual techniques;Passive range of motion;Dry needling;Taping;Vasopneumatic Device;Joint Manipulations    PT Next Visit Plan ROM/flexibility, manual if indicated for pain, strengthening    PT Home Exercise Plan Access Code: [email or web address]    Consulted and Agree with Plan of Care Patient             Patient will benefit from skilled therapeutic intervention in order to improve the following deficits and impairments:  Decreased range of motion, Difficulty walking, Increased muscle spasms, Pain, Decreased balance, Impaired flexibility, Decreased strength  Visit Diagnosis: Pain in left foot  Pain in right foot  Muscle weakness (generalized)  Difficulty in walking, not elsewhere classified  Plantar fasciitis of left foot  Stiffness of left ankle, not elsewhere  classified  Stiffness of right ankle, not elsewhere classified  Cramp and spasm     Problem List Patient Active Problem List   Diagnosis Date Noted   Primary osteoarthritis of right hip  02/13/2017   Osteoarthritis of right hip 02/08/2017   Preoperative cardiovascular examination 02/08/2017   Essential hypertension 02/08/2017   Mixed hyperlipidemia 02/08/2017    [facility] April Gordy Levan, [place], DPT 03/28/2021, 9:26 AM  [place]. [place], [place], [address] Phone: [phone]   Fax:  [phone]  Name: Madison Myers MRN: [id number] Date of Birth: 1946/02/10

## 2021-03-28 NOTE — Patient Instructions (Signed)
Access Code: [email or web address] URL: [email or web address] Date: 03/28/2021 Prepared by: Vernon Prey April Kirstie Peri  Exercises Standing Soleus Stretch on Step - 1 x daily - 7 x weekly - 2 sets - 30 sec hold Heel Raise on Step - 1 x daily - 7 x weekly - 2 sets - 10 reps Standing Tandem Balance with Counter Support - 1 x daily - 7 x weekly - 2 sets - 30 sec hold

## 2021-03-30 ENCOUNTER — Encounter: Payer: Self-pay | Admitting: Physical Therapy

## 2021-03-30 ENCOUNTER — Ambulatory Visit: Payer: Medicare HMO | Admitting: Physical Therapy

## 2021-03-30 ENCOUNTER — Other Ambulatory Visit: Payer: Self-pay

## 2021-03-30 DIAGNOSIS — R262 Difficulty in walking, not elsewhere classified: Secondary | ICD-10-CM

## 2021-03-30 DIAGNOSIS — M79671 Pain in right foot: Secondary | ICD-10-CM | POA: Diagnosis not present

## 2021-03-30 DIAGNOSIS — M6281 Muscle weakness (generalized): Secondary | ICD-10-CM

## 2021-03-30 DIAGNOSIS — M79672 Pain in left foot: Secondary | ICD-10-CM

## 2021-03-30 NOTE — Therapy (Signed)
[place]. [place], [place], [address] Phone: [phone]   Fax:  [phone]  Physical Therapy Treatment  Patient Details  Name: Madison Myers MRN: [id number] Date of Birth: May 10, 1946 Referring Provider (PT): Dr Melony Overly   Encounter Date: 03/30/2021  Physical Therapy Progress Note  Dates of Reporting Period: 02/24/21 to 03/30/21   PT End of Session - 03/30/21 0851     Visit Number 11    Number of Visits 16    Date for PT Re-Evaluation 04/14/21    PT Start Time 8832    PT Stop Time 0932    PT Time Calculation (min) 43 min    Activity Tolerance Patient tolerated treatment well    Behavior During Therapy [facility] for tasks assessed/performed             Past Medical History:  Diagnosis Date   Anxiety    takes medication as needed   Arthritis    Diabetes mellitus without complication ([place])    controlled with no complications - is not currently taking any medications 02/06/17   GERD (gastroesophageal reflux disease)    High cholesterol    History of hiatal hernia    Hypertension     Past Surgical History:  Procedure Laterality Date   ABDOMINAL HYSTERECTOMY     BREAST BIOPSY Left    CATARACT EXTRACTION W/ INTRAOCULAR LENS  IMPLANT, BILATERAL     HEMORRHOID SURGERY     HIP SURGERY Left 2010   in [place]   JOINT REPLACEMENT Left 2011   hip   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   Subjective Assessment - 03/30/21 0852     Subjective Pt states she is doing good. No pain.    Patient is accompained by: Interpreter    Pertinent History OA, osteoporosis    Patient Stated Goals To to be able to walk, garden, cook, etc without increased  pain.    Currently in Pain? No/denies                [facility] PT Assessment - 03/30/21 0001       AROM   AROM Assessment Site Ankle    Right Ankle Dorsiflexion 5    Left Ankle Dorsiflexion -5      Strength   Overall Strength Comments Rt ankle 5/5; Lt ankle eversion/Inv 5/5 (when tested in [facility], 4-/5 in sitting); DF limited by ROM; 5/5 in available range      Flexibility   Soft Tissue Assessment /Muscle Length --   marked tightness in left gastroc/soleus                          [facility] Adult PT Treatment/Exercise - 03/30/21 0001       Manual Therapy   Manual Therapy Joint mobilization    Joint Mobilization talocrural in supine and standing with active lunge/DF on 6 inch step x 10      Ankle Exercises: Stretches   Soleus Stretch 2 reps;60 seconds   on incline   Gastroc Stretch 2 reps;60 seconds   on incline     Ankle Exercises: Standing   SLS multiple reps bil  in II bars; attempted with no UE support, 2 finger support with bil and unilateral UE      Ankle Exercises: Supine   Other Supine Ankle Exercises Inv and Ever x 30 with knees together to avoid hip compensation on left      Ankle Exercises: Seated   Other Seated Ankle Exercises attempted band on left but pt unable to isolate eversion/inv without hip compensation.      Ankle Exercises: Aerobic   Nustep L5 x 6 min                     PT Education - 03/30/21 1049     Education Details Modified HEP to do ankle strengthening in [facility] to avoid hip movement; SLS using 2 finger support; and prolonged holds for gastroc/soleus stretching.    Person(s) Educated Patient    Methods Explanation;Demonstration;Handout;Verbal cues    Comprehension Verbalized understanding;Returned demonstration              PT Short Term Goals - 02/28/21 0921       PT SHORT TERM GOAL #1   Title Pt will be independent with initial HEP.    Status Achieved               PT Long Term Goals - 03/30/21  1055       PT LONG TERM GOAL #1   Title Pt will be independent with advanced HEP.    Status Partially Met      PT LONG TERM GOAL #2   Title Pt will increase B ankle strength to at least 4+/5 to allow her to garden.    Status Partially Met      PT LONG TERM GOAL #3   Title Pt will increase B ankle dorsiflexion to at least 0 degrees to allow for a more efficent gait pattern with her walking program.    Status On-going      PT LONG TERM GOAL #4   Title Pt will be able to perform B single leg stance of at least 10 seconds to decrease her risk of falling.    Baseline Rt 4 sec, Lt <2 sec    Status On-going                   Plan - 03/30/21 1056     Clinical Impression Statement Patient reports no pain in bil ankles. She still has marked ankle DF limitations in the left ankle. We worked on holding her stretches 1 min which she tolerated well. She has difficulty isolating left ankle movement from her hip and complains of hip discomfort when doing ankle exercises with bands. We worked on different positions. SDLY was the only position she could isolate ankle eversion and inversion without hip compensation. Left ankle strength tested 5/5 in this position. Her SLS is still difficulty bil. She has been doing advanced SLS balance exercises before today, so it's unclear why she was so unsteady today. Patient will benefit from continued PT to improve strength, balance, and her bil ankle DF to help normalize gait.    Comorbidities Hx of B hip surgeries    Examination-Activity Limitations Stairs;Locomotion Level    Examination-Participation Restrictions Yard Work;Community Activity    PT Frequency 2x / week    PT Duration 8 weeks    PT Treatment/Interventions ADLs/Self Care Home Management;Cryotherapy;Electrical Stimulation;Iontophoresis 57m/ml Dexamethasone;Moist Heat;Ultrasound;Gait training;Stair training;Functional mobility training;Therapeutic activities;Therapeutic exercise;Balance  training;Neuromuscular re-education;Patient/family education;Manual techniques;Passive range of motion;Dry needling;Taping;Vasopneumatic Device;Joint Manipulations  PT Next Visit Plan ROM/flexibility, manual if indicated for pain, strengthening, SLS    PT Home Exercise Plan Access Code: [email or web address]             Patient will benefit from skilled therapeutic intervention in order to improve the following deficits and impairments:  Decreased range of motion, Difficulty walking, Increased muscle spasms, Pain, Decreased balance, Impaired flexibility, Decreased strength  Visit Diagnosis: Pain in left foot  Pain in right foot  Muscle weakness (generalized)  Difficulty in walking, not elsewhere classified     Problem List Patient Active Problem List   Diagnosis Date Noted   Primary osteoarthritis of right hip 02/13/2017   Osteoarthritis of right hip 02/08/2017   Preoperative cardiovascular examination 02/08/2017   Essential hypertension 02/08/2017   Mixed hyperlipidemia 02/08/2017   Madelyn Flavors PT 03/30/2021, 11:09 AM  [place]. [place], [place], [address] Phone: [phone]   Fax:  [phone]  Name: Madison Myers MRN: [id number] Date of Birth: [birth year]

## 2021-04-04 ENCOUNTER — Ambulatory Visit: Payer: Medicare HMO | Admitting: Physical Therapy

## 2021-04-04 ENCOUNTER — Other Ambulatory Visit: Payer: Self-pay

## 2021-04-04 DIAGNOSIS — M25671 Stiffness of right ankle, not elsewhere classified: Secondary | ICD-10-CM

## 2021-04-04 DIAGNOSIS — M79671 Pain in right foot: Secondary | ICD-10-CM | POA: Diagnosis not present

## 2021-04-04 DIAGNOSIS — R262 Difficulty in walking, not elsewhere classified: Secondary | ICD-10-CM

## 2021-04-04 DIAGNOSIS — M6281 Muscle weakness (generalized): Secondary | ICD-10-CM

## 2021-04-04 NOTE — Therapy (Signed)
[place]. [place], [place], [address] Phone: [phone]   Fax:  [phone]  Physical Therapy Treatment  Patient Details  Name: Madison Myers MRN: [id number] Date of Birth: 08-10-1945 Referring Provider (PT): Dr Melony Overly   Encounter Date: 04/04/2021   PT End of Session - 04/04/21 0919     Visit Number 13    Date for PT Re-Evaluation 04/14/21             Past Medical History:  Diagnosis Date   Anxiety    takes medication as needed   Arthritis    Diabetes mellitus without complication ([place])    controlled with no complications - is not currently taking any medications 02/06/17   GERD (gastroesophageal reflux disease)    High cholesterol    History of hiatal hernia    Hypertension     Past Surgical History:  Procedure Laterality Date   ABDOMINAL HYSTERECTOMY     BREAST BIOPSY Left    CATARACT EXTRACTION W/ INTRAOCULAR LENS  IMPLANT, BILATERAL     HEMORRHOID SURGERY     HIP SURGERY Left 2010   in [place]   JOINT REPLACEMENT Left 2011   hip   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   Subjective Assessment - 04/04/21 0835     Subjective amb in carrying cane -states only uses with uneven surfaces d/t 3 hip surgeries- denies pain states been doing very well    Patient is accompained by: Interpreter    Currently in Pain? No/denies                [facility] PT Assessment - 04/04/21 0001       AROM   Left Ankle Dorsiflexion -5   PROM 0                          [facility] Adult PT Treatment/Exercise - 04/04/21 0001       Manual Therapy   Manual Therapy Joint mobilization;Passive ROM    Joint Mobilization to increase  DF    Passive ROM focus on DF      Ankle Exercises: Aerobic   Tread Mill OFF push/pull LLE only 20 each    Nustep L5 x 6 min LE only    Other Aerobic 20# resisited gait 5x 4 ways      Ankle Exercises: Stretches   Soleus Stretch 3 reps;30 seconds    Gastroc Stretch 3 reps;30 seconds      Ankle Exercises: Standing   Vector Stance Left   on airex CG/min A   Heel Raises Both;20 reps   plus toe raises and LLE only 10 each   Other Standing Ankle Exercises step up over and rev with UE support 2 inch 10 x and 4 inch 10 x    Other Standing Ankle Exercises tandem stance 10 sec 3 each way      Ankle Exercises: Machines for Strengthening   Cybex Leg Press 20# 2 sets 10. LLE only 10 x 20#                       PT Short Term Goals -  02/28/21 0921       PT SHORT TERM GOAL #1   Title Pt will be independent with initial HEP.    Status Achieved               PT Long Term Goals - 03/30/21 1055       PT LONG TERM GOAL #1   Title Pt will be independent with advanced HEP.    Status Partially Met      PT LONG TERM GOAL #2   Title Pt will increase B ankle strength to at least 4+/5 to allow her to garden.    Status Partially Met      PT LONG TERM GOAL #3   Title Pt will increase B ankle dorsiflexion to at least 0 degrees to allow for a more efficent gait pattern with her walking program.    Status On-going      PT LONG TERM GOAL #4   Title Pt will be able to perform B single leg stance of at least 10 seconds to decrease her risk of falling.    Baseline Rt 4 sec, Lt <2 sec    Status On-going                   Plan - 04/04/21 0923     Clinical Impression Statement pt still very tight in left gastroc and soleus limited DF. pt holds LLE on ER with ex and gait.decreased SL ex and decreased balance on dynamic surfaces    PT Treatment/Interventions ADLs/Self Care Home Management;Cryotherapy;Electrical Stimulation;Iontophoresis 26m/ml Dexamethasone;Moist  Heat;Ultrasound;Gait training;Stair training;Functional mobility training;Therapeutic activities;Therapeutic exercise;Balance training;Neuromuscular re-education;Patient/family education;Manual techniques;Passive range of motion;Dry needling;Taping;Vasopneumatic Device;Joint Manipulations    PT Next Visit Plan ROM/flexibility strengthening, SLS - pt would like to continue PT after 11/4 date for ROM,strength and balcne will need to address goals and ask for more visits             Patient will benefit from skilled therapeutic intervention in order to improve the following deficits and impairments:  Decreased range of motion, Difficulty walking, Increased muscle spasms, Pain, Decreased balance, Impaired flexibility, Decreased strength  Visit Diagnosis: Muscle weakness (generalized)  Difficulty in walking, not elsewhere classified  Stiffness of right ankle, not elsewhere classified     Problem List Patient Active Problem List   Diagnosis Date Noted   Primary osteoarthritis of right hip 02/13/2017   Osteoarthritis of right hip 02/08/2017   Preoperative cardiovascular examination 02/08/2017   Essential hypertension 02/08/2017   Mixed hyperlipidemia 02/08/2017    Deanne Bedgood,ANGIE, PTA 04/04/2021, 9:26 AM  [place] [address]Phone: [phone]  Fax:  [phone] Name: MLoraine BhullarMRN: [id number] of Birth: 502-20-1947

## 2021-04-06 ENCOUNTER — Ambulatory Visit: Payer: Medicare HMO | Admitting: Physical Therapy

## 2021-04-06 ENCOUNTER — Other Ambulatory Visit: Payer: Self-pay

## 2021-04-06 DIAGNOSIS — M79671 Pain in right foot: Secondary | ICD-10-CM | POA: Diagnosis not present

## 2021-04-06 DIAGNOSIS — R262 Difficulty in walking, not elsewhere classified: Secondary | ICD-10-CM

## 2021-04-06 DIAGNOSIS — M6281 Muscle weakness (generalized): Secondary | ICD-10-CM

## 2021-04-06 NOTE — Therapy (Signed)
[place]. [place], [place], [address] Phone: [phone]   Fax:  [phone]  Physical Therapy Treatment  Patient Details  Name: Madison Myers MRN: [id number] Date of Birth: [birth year] Referring Provider (PT): Dr Melony Overly   Encounter Date: 04/06/2021   PT End of Session - 04/06/21 0920     Visit Number 14    Number of Visits 16    Date for PT Re-Evaluation 04/14/21    PT Start Time 0840    PT Stop Time 0920    PT Time Calculation (min) 40 min             Past Medical History:  Diagnosis Date   Anxiety    takes medication as needed   Arthritis    Diabetes mellitus without complication ([place])    controlled with no complications - is not currently taking any medications 02/06/17   GERD (gastroesophageal reflux disease)    High cholesterol    History of hiatal hernia    Hypertension     Past Surgical History:  Procedure Laterality Date   ABDOMINAL HYSTERECTOMY     BREAST BIOPSY Left    CATARACT EXTRACTION W/ INTRAOCULAR LENS  IMPLANT, BILATERAL     HEMORRHOID SURGERY     HIP SURGERY Left 2010   in [place]   JOINT REPLACEMENT Left 2011   hip   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   Subjective Assessment - 04/06/21 0842     Subjective slow on cold mornings  but doing good    Currently in Pain? No/denies                               [facility] Adult PT Treatment/Exercise - 04/06/21 0001       Ankle Exercises: Aerobic   Nustep L5 x 6 min LE only    Other Aerobic 30# resisited gait 5x 4 ways      Ankle Exercises: Standing   Heel Walk (Round Trip) 2x    Toe Walk (Round Trip) 2x    Other Standing Ankle  Exercises BOSU step up fwd adn laterally. squats and heel raises 15 each. 6 inch step up ,down reverse 2 sets 10 LLE      Ankle Exercises: Machines for Strengthening   Cybex Leg Press 30# 2 sets 10. LLE only 10 x 20#   30# calf raises 2 sets 10     Ankle Exercises: Seated   Other Seated Ankle Exercises sit to stand   hs curl 25# 2 sets 10. knee ext 10# 2 set s10                      PT Short Term Goals - 02/28/21 3818       PT SHORT TERM GOAL #1   Title Pt will be independent with initial HEP.    Status Achieved               PT Long Term Goals - 04/06/21 0911       PT LONG TERM GOAL #1   Title Pt will be independent with advanced HEP.  Status Partially Met      PT LONG TERM GOAL #2   Title Pt will increase B ankle strength to at least 4+/5 to allow her to garden.    Baseline Left limited. RT WFLS    Status Partially Met      PT LONG TERM GOAL #3   Title Pt will increase B ankle dorsiflexion to at least 0 degrees to allow for a more efficent gait pattern with her walking program.    Baseline Left -5 DF    Status Partially Met      PT LONG TERM GOAL #4   Title Pt will be able to perform B single leg stance of at least 10 seconds to decrease her risk of falling.    Baseline RT  5 sec    Left 2 sec    Status Partially Met                   Plan - 04/06/21 0920     Clinical Impression Statement progressed strentgtheing with increased wt and ex- cuing verb and tactile needed. progressing with goals.    PT Treatment/Interventions ADLs/Self Care Home Management;Cryotherapy;Electrical Stimulation;Iontophoresis 54m/ml Dexamethasone;Moist Heat;Ultrasound;Gait training;Stair training;Functional mobility training;Therapeutic activities;Therapeutic exercise;Balance training;Neuromuscular re-education;Patient/family education;Manual techniques;Passive range of motion;Dry needling;Taping;Vasopneumatic Device;Joint Manipulations    PT Next Visit Plan  ROM/flexibility strengthening, SLS - pt would like to continue PT after 11/4 date for ROM,strength and balcne will need to address goals and ask for more visits             Patient will benefit from skilled therapeutic intervention in order to improve the following deficits and impairments:  Decreased range of motion, Difficulty walking, Increased muscle spasms, Pain, Decreased balance, Impaired flexibility, Decreased strength  Visit Diagnosis: Muscle weakness (generalized)  Difficulty in walking, not elsewhere classified     Problem List Patient Active Problem List   Diagnosis Date Noted   Primary osteoarthritis of right hip 02/13/2017   Osteoarthritis of right hip 02/08/2017   Preoperative cardiovascular examination 02/08/2017   Essential hypertension 02/08/2017   Mixed hyperlipidemia 02/08/2017    Harvard Zeiss,ANGIE, PTA 04/06/2021, 9:22 AM  [place] [address]Phone: [phone]  Fax:  [phone] Name: Madison HorstmanMRN: [id number] of Birth: [birth year]

## 2021-04-11 ENCOUNTER — Other Ambulatory Visit: Payer: Self-pay

## 2021-04-11 ENCOUNTER — Ambulatory Visit: Payer: Medicare HMO | Attending: Orthopaedic Surgery | Admitting: Physical Therapy

## 2021-04-11 DIAGNOSIS — M6281 Muscle weakness (generalized): Secondary | ICD-10-CM | POA: Diagnosis present

## 2021-04-11 DIAGNOSIS — R262 Difficulty in walking, not elsewhere classified: Secondary | ICD-10-CM | POA: Diagnosis present

## 2021-04-11 DIAGNOSIS — M25672 Stiffness of left ankle, not elsewhere classified: Secondary | ICD-10-CM | POA: Diagnosis present

## 2021-04-11 NOTE — Therapy (Signed)
[place]. [place], [place], [address] Phone: [phone]   Fax:  [phone]  Physical Therapy Treatment  Patient Details  Name: Madison Myers MRN: [id number] Date of Birth: 05-28-1946 Referring Provider (PT): Dr Melony Overly   Encounter Date: 04/11/2021   PT End of Session - 04/11/21 0924     Visit Number 15    Number of Visits 16    Date for PT Re-Evaluation 04/14/21    PT Start Time 0825    PT Stop Time 0910    PT Time Calculation (min) 45 min             Past Medical History:  Diagnosis Date   Anxiety    takes medication as needed   Arthritis    Diabetes mellitus without complication ([place])    controlled with no complications - is not currently taking any medications 02/06/17   GERD (gastroesophageal reflux disease)    High cholesterol    History of hiatal hernia    Hypertension     Past Surgical History:  Procedure Laterality Date   ABDOMINAL HYSTERECTOMY     BREAST BIOPSY Left    CATARACT EXTRACTION W/ INTRAOCULAR LENS  IMPLANT, BILATERAL     HEMORRHOID SURGERY     HIP SURGERY Left 2010   in [place]   JOINT REPLACEMENT Left 2011   hip   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                      [facility] Adult PT Treatment/Exercise - 04/11/21 0001       Ankle Exercises: Machines for Strengthening   Cybex Leg Press 30# 3 sets 10- feet 3 ways. LLE only 10 x 20#      Ankle Exercises: Seated   Other Seated Ankle Exercises HS curl 20# 2 set s10 BIL, LLE 15# 10 x   LAQ 10# 2 sets 10, 5# ecc LLE   Other Seated Ankle Exercises sit to stand   on airex 15 x without UE\     Ankle Exercises: Aerobic   Tread Mill OFF  push/pull LLE only 20 each    Nustep L5 x 6 min LE only    Other Aerobic 30# resisited gait 5x 4 ways      Ankle Exercises: Standing   Heel Raises Both;20 reps   toe riaises 20 x both on black bar   Other Standing Ankle Exercises STS on airex 15 x    Other Standing Ankle Exercises step up ,over and reverse                       PT Short Term Goals - 02/28/21 7972       PT SHORT TERM GOAL #1   Title Pt will be independent with initial HEP.    Status Achieved               PT Long Term Goals - 04/06/21 0911       PT LONG TERM GOAL #1   Title Pt will be independent with advanced HEP.    Status Partially Met      PT LONG  TERM GOAL #2   Title Pt will increase B ankle strength to at least 4+/5 to allow her to garden.    Baseline Left limited. RT WFLS    Status Partially Met      PT LONG TERM GOAL #3   Title Pt will increase B ankle dorsiflexion to at least 0 degrees to allow for a more efficent gait pattern with her walking program.    Baseline Left -5 DF    Status Partially Met      PT LONG TERM GOAL #4   Title Pt will be able to perform B single leg stance of at least 10 seconds to decrease her risk of falling.    Baseline RT  5 sec    Left 2 sec    Status Partially Met                   Plan - 04/11/21 0928     Clinical Impression Statement pt is progressing well. increased wt machine interventions with noted fatigue. pt states she will need to stop PT on 11/8 d/t no transporation. so will D/C net session and assure pt is good with HEP.    PT Treatment/Interventions ADLs/Self Care Home Management;Cryotherapy;Electrical Stimulation;Iontophoresis 80m/ml Dexamethasone;Moist Heat;Ultrasound;Gait training;Stair training;Functional mobility training;Therapeutic activities;Therapeutic exercise;Balance training;Neuromuscular re-education;Patient/family education;Manual techniques;Passive range of motion;Dry needling;Taping;Vasopneumatic Device;Joint  Manipulations    PT Next Visit Plan check goals ,assure good with HEP and D/C             Patient will benefit from skilled therapeutic intervention in order to improve the following deficits and impairments:  Decreased range of motion, Difficulty walking, Increased muscle spasms, Pain, Decreased balance, Impaired flexibility, Decreased strength  Visit Diagnosis: Muscle weakness (generalized)  Difficulty in walking, not elsewhere classified     Problem List Patient Active Problem List   Diagnosis Date Noted   Primary osteoarthritis of right hip 02/13/2017   Osteoarthritis of right hip 02/08/2017   Preoperative cardiovascular examination 02/08/2017   Essential hypertension 02/08/2017   Mixed hyperlipidemia 02/08/2017    Madison Myers,ANGIE, PTA 04/11/2021, 9:30 AM  [place] [address]Phone: [phone]  Fax:  [phone] Name: Madison BehlMRN: [id number] of Birth: [birth year]

## 2021-04-13 ENCOUNTER — Other Ambulatory Visit: Payer: Self-pay

## 2021-04-13 ENCOUNTER — Ambulatory Visit: Payer: Medicare HMO | Admitting: Physical Therapy

## 2021-04-13 DIAGNOSIS — M6281 Muscle weakness (generalized): Secondary | ICD-10-CM

## 2021-04-13 DIAGNOSIS — M25672 Stiffness of left ankle, not elsewhere classified: Secondary | ICD-10-CM

## 2021-04-13 DIAGNOSIS — R262 Difficulty in walking, not elsewhere classified: Secondary | ICD-10-CM

## 2021-04-13 NOTE — Therapy (Signed)
[place]. [place], [place], [address] Phone: [phone]   Fax:  [phone]  Physical Therapy Treatment  Patient Details  Name: Madison Myers MRN: [id number] Date of Birth: [birth year] Referring Provider (PT): Dr Melony Overly   Encounter Date: 04/13/2021   PT End of Session - 04/13/21 0917     Visit Number 16    Date for PT Re-Evaluation 04/14/21    PT Start Time 0830    PT Stop Time 0915    PT Time Calculation (min) 45 min             Past Medical History:  Diagnosis Date   Anxiety    takes medication as needed   Arthritis    Diabetes mellitus without complication ([place])    controlled with no complications - is not currently taking any medications 02/06/17   GERD (gastroesophageal reflux disease)    High cholesterol    History of hiatal hernia    Hypertension     Past Surgical History:  Procedure Laterality Date   ABDOMINAL HYSTERECTOMY     BREAST BIOPSY Left    CATARACT EXTRACTION W/ INTRAOCULAR LENS  IMPLANT, BILATERAL     HEMORRHOID SURGERY     HIP SURGERY Left 2010   in [place]   JOINT REPLACEMENT Left 2011   hip   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   Subjective Assessment - 04/13/21 0837     Subjective sore form last session. muscles in entire left leg get stiff    Patient is accompained by: Interpreter    Currently in Pain? Yes    Pain Score 2     Pain Location Leg    Pain Orientation Left                               [facility] Adult PT Treatment/Exercise - 04/13/21 0001       High Level Balance   High Level Balance Activities Backward walking;Side stepping;Tandem walking;Marching forwards   on  foam beam   High Level Balance Comments on foam mat hip 3 way red tband 10 each   rocker board 20 x each way in// bars     Ankle Exercises: Aerobic   Nustep L5 x 6 min LE only    Other Aerobic 30# resisited gait 5x 4 ways      Ankle Exercises: Standing   Vector Stance Left    Heel Raises Both;20 reps   black bar plus toe raises   Other Standing Ankle Exercises sit fit LEFT LE 15x 4 way    Other Standing Ankle Exercises step up 15x, laterally 15 x and down 15 x LLE 6 inch UE support      Ankle Exercises: Seated   Other Seated Ankle Exercises sit to stand 15x   on airex with ball toss                    PT Education - 04/13/21 0916     Education Details reviewed HEPS    Person(s) Educated Patient;Other (comment)    Methods Explanation;Demonstration    Comprehension Verbalized understanding  PT Short Term Goals - 02/28/21 4098       PT SHORT TERM GOAL #1   Title Pt will be independent with initial HEP.    Status Achieved               PT Long Term Goals - 04/13/21 1191       PT LONG TERM GOAL #1   Title Pt will be independent with advanced HEP.    Status Achieved      PT LONG TERM GOAL #2   Title Pt will increase B ankle strength to at least 4+/5 to allow her to garden.    Baseline Left limited. RT WFLS    Status Partially Met      PT LONG TERM GOAL #3   Title Pt will increase B ankle dorsiflexion to at least 0 degrees to allow for a more efficent gait pattern with her walking program.    Baseline Left -5 DF    Status Partially Met      PT LONG TERM GOAL #4   Title Pt will be able to perform B single leg stance of at least 10 seconds to decrease her risk of falling.    Baseline RT  5 sec    Left 3 sec    Status Partially Met                   Plan - 04/13/21 0917     Clinical Impression Statement goals partilly met, pt request D/C d/t transportatio issues. reviewed HEPs and maintance at home and she has a good  comprehesive program    PT Treatment/Interventions ADLs/Self Care Home Management;Cryotherapy;Electrical Stimulation;Iontophoresis 9m/ml Dexamethasone;Moist Heat;Ultrasound;Gait training;Stair training;Functional mobility training;Therapeutic activities;Therapeutic exercise;Balance training;Neuromuscular re-education;Patient/family education;Manual techniques;Passive range of motion;Dry needling;Taping;Vasopneumatic Device;Joint Manipulations    PT Next Visit Plan D/C             Patient will benefit from skilled therapeutic intervention in order to improve the following deficits and impairments:  Decreased range of motion, Difficulty walking, Increased muscle spasms, Pain, Decreased balance, Impaired flexibility, Decreased strength  Visit Diagnosis: Muscle weakness (generalized)  Difficulty in walking, not elsewhere classified  Stiffness of left ankle, not elsewhere classified     Problem List Patient Active Problem List   Diagnosis Date Noted   Primary osteoarthritis of right hip 02/13/2017   Osteoarthritis of right hip 02/08/2017   Preoperative cardiovascular examination 02/08/2017   Essential hypertension 02/08/2017   Mixed hyperlipidemia 02/08/2017  PHYSICAL THERAPY DISCHARGE SUMMARY   Patient agrees to discharge. Patient goals were partially met. Patient is being discharged due to the patient's request.   Madison Myers,ANGIE, PTA 04/13/2021, 9:18 AM  [place] [address]Phone: [phone]  Fax:  [phone] Name: MPascale MavesMRN: [id number] of Birth: [birth year]

## 2021-04-18 ENCOUNTER — Ambulatory Visit: Payer: Medicare HMO | Admitting: Physical Therapy

## 2021-04-20 ENCOUNTER — Ambulatory Visit: Payer: Medicare HMO | Admitting: Physical Therapy

## 2021-04-25 ENCOUNTER — Ambulatory Visit: Payer: Medicare HMO | Admitting: Physical Therapy

## 2021-04-27 ENCOUNTER — Ambulatory Visit: Payer: Medicare HMO | Admitting: Physical Therapy

## 2021-08-09 ENCOUNTER — Other Ambulatory Visit: Payer: Self-pay | Admitting: Internal Medicine

## 2021-08-09 DIAGNOSIS — Z1231 Encounter for screening mammogram for malignant neoplasm of breast: Secondary | ICD-10-CM

## 2021-12-30 IMAGING — MG MM DIGITAL SCREENING BILAT W/ TOMO AND CAD
6 of 10 series · 6 of 30 positions shown · non-contrast
Comparison: Previous exam(s).

CLINICAL DATA: Screening.

EXAM:
DIGITAL SCREENING BILATERAL MAMMOGRAM WITH TOMOSYNTHESIS AND CAD
TECHNIQUE: Bilateral screening digital craniocaudal and mediolateral oblique
mammograms were obtained. Bilateral screening digital breast
tomosynthesis was performed. The images were evaluated with
computer-aided detection.

[L CC synth-2D]
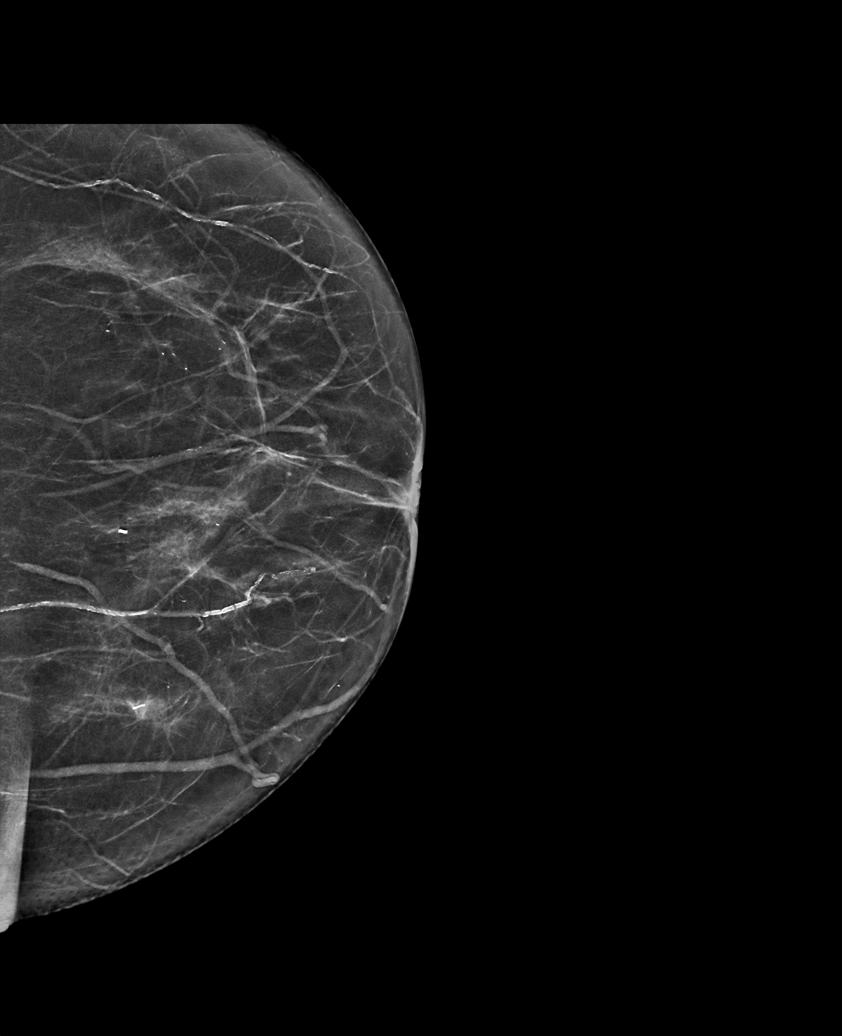

[R CC synth-2D (1 of 2)]
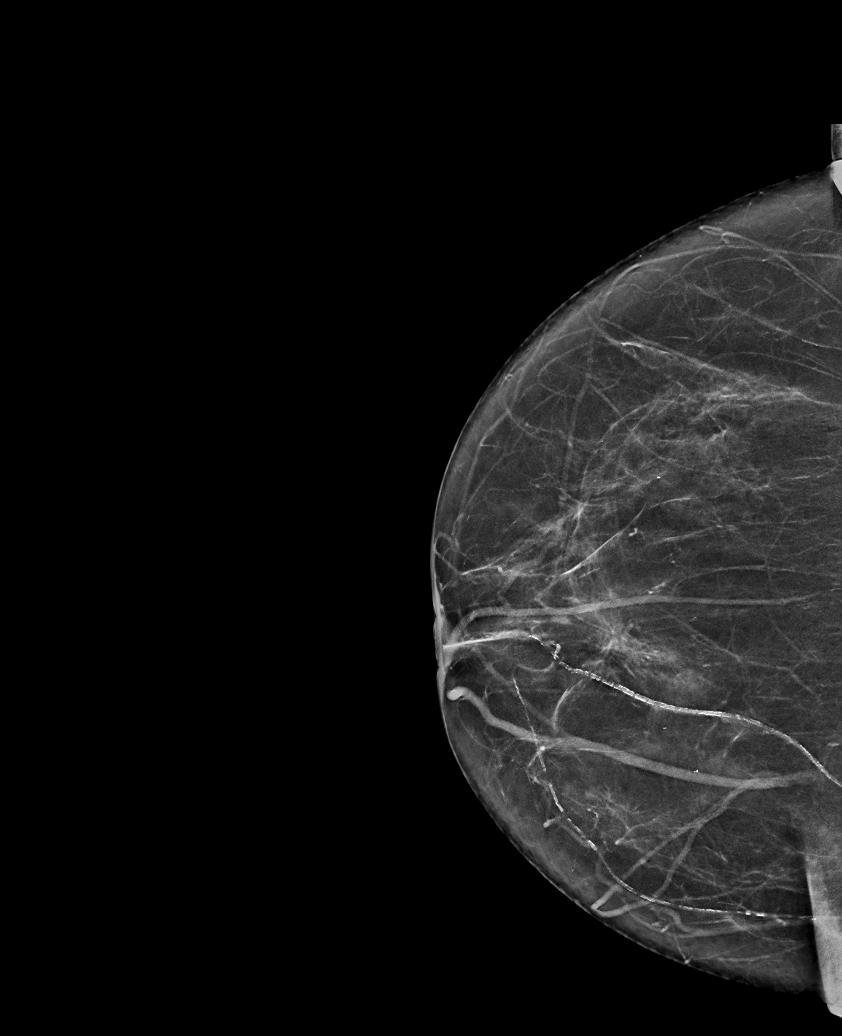

[R MLO synth-2D]
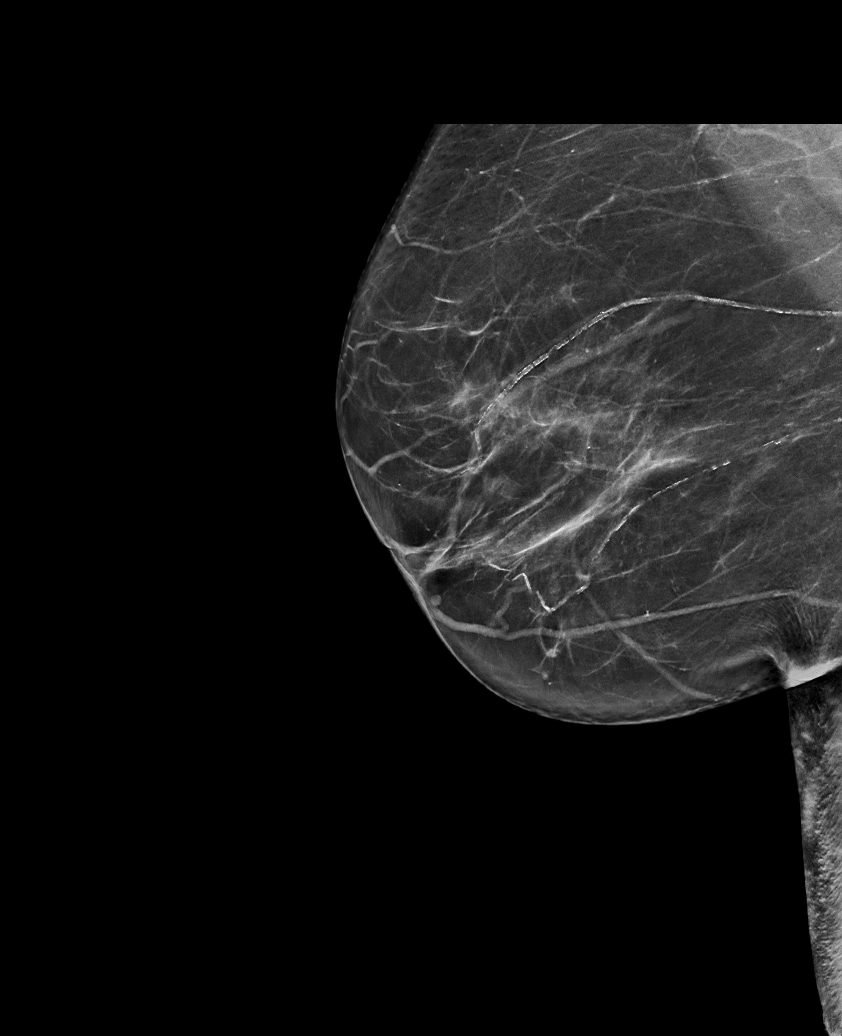

[L MLO synth-2D]
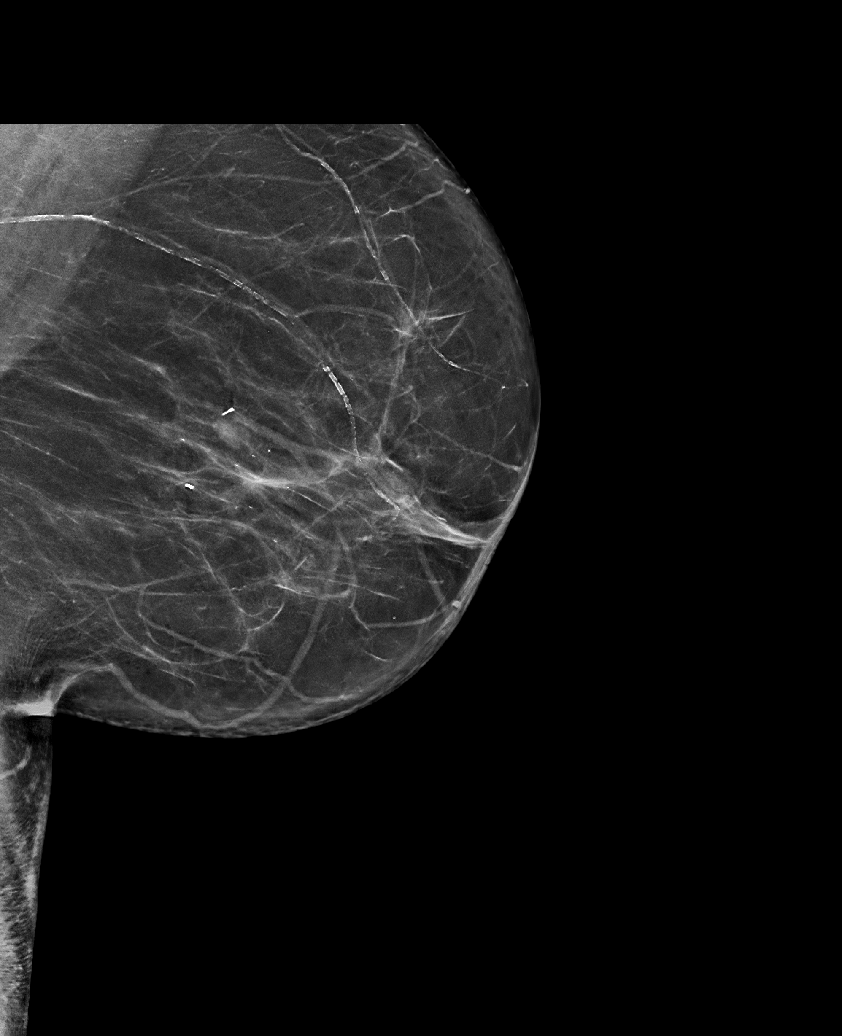

[R CC synth-2D (2 of 2)]
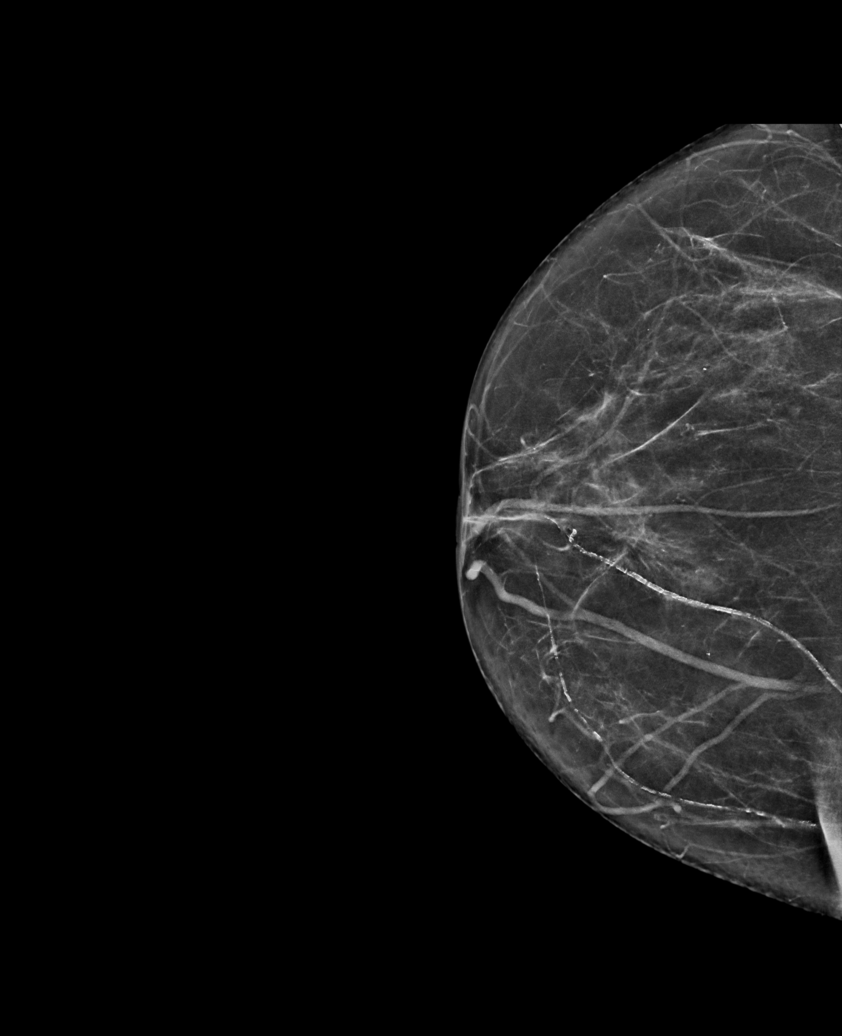

[R CC tomo · tomo slice 35/68.0]
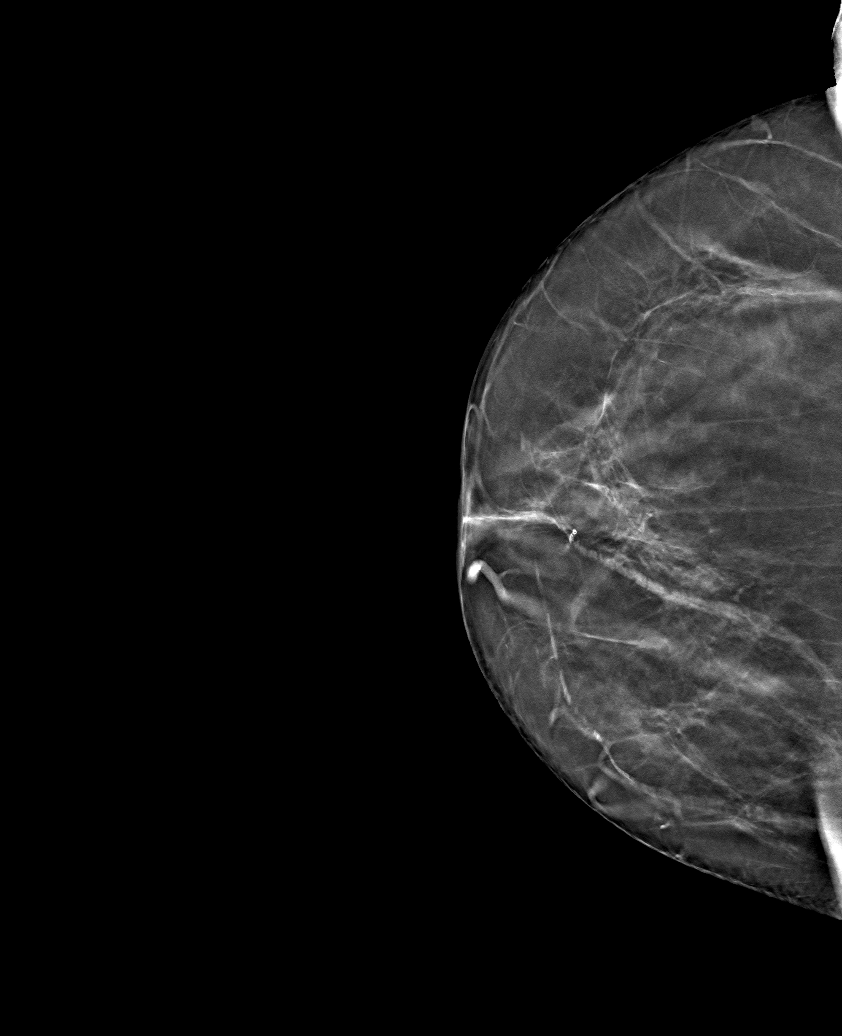

[6 of 30 positions shown; findings below may reference images not displayed]

ACR Breast Density Category b: There are scattered areas of
fibroglandular density.
FINDINGS: There are no findings suspicious for malignancy.
IMPRESSION: No mammographic evidence of malignancy. A result letter of this
screening mammogram will be mailed directly to the patient.

RECOMMENDATION:
Screening mammogram in one year. (Code:51-O-LD2)

BI-RADS CATEGORY  1: Negative.

## 2022-03-13 ENCOUNTER — Ambulatory Visit
Admission: RE | Admit: 2022-03-13 | Discharge: 2022-03-13 | Disposition: A | Discharge status: Home / Self Care | Payer: Medicare HMO | Source: Ambulatory Visit | Attending: Internal Medicine | Admitting: Internal Medicine

## 2022-03-13 DIAGNOSIS — Z1231 Encounter for screening mammogram for malignant neoplasm of breast: Secondary | ICD-10-CM

## 2022-07-02 ENCOUNTER — Encounter (HOSPITAL_COMMUNITY): Payer: Self-pay

## 2022-07-02 ENCOUNTER — Emergency Department (HOSPITAL_COMMUNITY)
Admission: EM | Admit: 2022-07-02 | Discharge: 2022-07-02 | Disposition: A | Discharge status: Home / Self Care | Payer: Medicare HMO | Attending: Emergency Medicine | Admitting: Emergency Medicine

## 2022-07-02 DIAGNOSIS — M130 Polyarthritis, unspecified: Secondary | ICD-10-CM | POA: Insufficient documentation

## 2022-07-02 DIAGNOSIS — E11618 Type 2 diabetes mellitus with other diabetic arthropathy: Secondary | ICD-10-CM | POA: Insufficient documentation

## 2022-07-02 DIAGNOSIS — Z79899 Other long term (current) drug therapy: Secondary | ICD-10-CM | POA: Insufficient documentation

## 2022-07-02 DIAGNOSIS — M25561 Pain in right knee: Secondary | ICD-10-CM | POA: Diagnosis present

## 2022-07-02 DIAGNOSIS — I10 Essential (primary) hypertension: Secondary | ICD-10-CM | POA: Insufficient documentation

## 2022-07-02 HISTORY — DX: Gastritis, unspecified, without bleeding: K29.70

## 2022-07-02 MED ORDER — HYDROCODONE-ACETAMINOPHEN 5-325 MG PO TABS: 1.0000 | ORAL_TABLET | Freq: Four times a day (QID) | ORAL | 0 refills | Status: AC | PRN

## 2022-07-02 NOTE — ED Provider Notes (Signed)
[facility] Provider Note   CSN: [id number] Arrival date & time: 07/02/22  1003     History  No chief complaint on file.   Madison Myers is a 77 y.o. female.  Patient followed by [facility] primary care.  Using interpreter it sounds as if patient has been seen by [facility] orthopedics with workup and then referral to rheumatology has not been seen by rheumatology.  Patient states that contacted primary care provider they told her to come to the emergency department.  Chart review shows no records of this at this time.  Normally we can see [facility] records.  We do not have any records from [facility] or any recommendation for referral for rheumatology.  Patient's complaint is sort of a polyarthritis that is been ongoing treated with tramadol for that that prescription is due to run out today.  Patient is hoping for definitive diagnosis and workup.  Sounds like blood works been done and they are expecting rheumatology to take it from there.  Patient has complaint of pain to her right hand her right knee and left shoulder.  No falls or injuries.  Extensive amount of time was spent with the interpreter.  Patient also known to have problems with osteoporosis.  And gets injections by primary care doctor.  Past medical history also sniffing for hypertension high cholesterol anxiety gastroesophageal reflux disease diabetes without complications.       Home Medications Prior to Admission medications   Medication Sig Start Date End Date Taking? Authorizing Provider  amLODipine (NORVASC) 10 MG tablet Take 10 mg by mouth daily.   Yes [provider]  calcitRIOL (ROCALTROL) 0.25 MCG capsule Take 0.25 mcg by mouth daily.   Yes [provider]  Cholecalciferol (VITAMIN D PO) Take 1 tablet by mouth daily.   Yes [provider]  cyanocobalamin (VITAMIN B12) 1000 MCG tablet Take 1,000 mcg by mouth daily.    Yes [provider]  HYDROcodone-acetaminophen (NORCO/VICODIN) 5-325 MG tablet Take 1-2 tablets by mouth every 6 (six) hours as needed for moderate pain. 07/02/22  Yes Fredia Sorrow, MD  latanoprost (XALATAN) 0.005 % ophthalmic solution Place 1 drop into both eyes at bedtime.   Yes [provider]  lisinopril-hydrochlorothiazide (ZESTORETIC) 20-25 MG tablet Take 1 tablet by mouth daily. 03/27/22  Yes [provider]  Omega-3 Fatty Acids (FISH OIL PO) Take 1 capsule by mouth at bedtime.   Yes [provider]  omeprazole (PRILOSEC) 20 MG capsule Take 20 mg by mouth daily as needed (heartburn).   Yes [provider]  polyvinyl alcohol (LIQUIFILM TEARS) 1.4 % ophthalmic solution Place 1 drop into both eyes every 4 (four) hours as needed for dry eyes.   Yes [provider]  traMADol (ULTRAM) 50 MG tablet Take 50 mg by mouth every 8 (eight) hours.   Yes [provider]  aspirin EC 325 MG tablet Take 1 tablet (325 mg total) by mouth 2 (two) times daily. Patient not taking: Reported on 07/02/2022 02/13/17   Leighton Parody, PA-C  oxyCODONE-acetaminophen (ROXICET) 5-325 MG tablet Take 1 tablet by mouth every 4 (four) hours as needed. Patient not taking: Reported on 07/02/2022 02/13/17   Leighton Parody, PA-C  tiZANidine (ZANAFLEX) 2 MG tablet Take 1 tablet (2 mg total) by mouth every 6 (six) hours as needed for muscle spasms. Patient not taking: Reported on 07/02/2022 02/13/17   Leighton Parody, PA-C  Allergies    Carbamazepine    Review of Systems   Review of Systems  Constitutional:  Negative for chills and fever.  HENT:  Negative for ear pain and sore throat.   Eyes:  Negative for pain and visual disturbance.  Respiratory:  Negative for cough and shortness of breath.   Cardiovascular:  Negative for chest pain and palpitations.  Gastrointestinal:  Negative for abdominal pain and vomiting.  Genitourinary:  Negative for dysuria and  hematuria.  Musculoskeletal:  Positive for arthralgias. Negative for back pain.  Skin:  Negative for color change and rash.  Neurological:  Negative for seizures and syncope.  All other systems reviewed and are negative.   Physical Exam Updated Vital Signs BP 121/67   Pulse 86   Temp 99.7 F (37.6 C)   Resp 18   SpO2 94%  Physical Exam Vitals and nursing note reviewed.  Constitutional:      General: She is not in acute distress.    Appearance: Normal appearance. She is well-developed.  HENT:     Head: Normocephalic and atraumatic.  Eyes:     Extraocular Movements: Extraocular movements intact.     Conjunctiva/sclera: Conjunctivae normal.     Pupils: Pupils are equal, round, and reactive to light.  Cardiovascular:     Rate and Rhythm: Normal rate and regular rhythm.     Heart sounds: No murmur heard. Pulmonary:     Effort: Pulmonary effort is normal. No respiratory distress.     Breath sounds: Normal breath sounds.  Abdominal:     Palpations: Abdomen is soft.     Tenderness: There is no abdominal tenderness.  Musculoskeletal:        General: Swelling and tenderness present.     Cervical back: Normal range of motion and neck supple.     Comments: Left hand at the index finger metacarpal area with swelling and redness.  Left shoulder with increased warmth and tenderness with range of motion.  Right knee with swelling but no erythema increased warmth.  Skin:    General: Skin is warm and dry.     Capillary Refill: Capillary refill takes less than 2 seconds.  Neurological:     General: No focal deficit present.     Mental Status: She is alert and oriented to person, place, and time.     Cranial Nerves: No cranial nerve deficit.     Sensory: No sensory deficit.  Psychiatric:        Mood and Affect: Mood normal.     ED Results / Procedures / Treatments   Labs (all labs ordered are listed, but only abnormal results are displayed) Labs Reviewed - No data to  display  EKG None  Radiology No results found.  Procedures Procedures    Medications Ordered in ED Medications - No data to display  ED Course/ Medical Decision Making/ A&P                             Medical Decision Making Risk Prescription drug management.   This seems to be consistent with an inflammatory arthritis.  Will treat with hydrocodone.  Do not see in the indication for rex-raying these joints.  Patient has been seen by [facility] orthopedics and then referred to rheumatology.  Patient nontoxic no acute distress not febrile not hypoxic not hypotensive.  New prescription for hydrocodone provided.  To help in the meantime recommend she follow back up with primary  care doctor to see where they are with the furl to rheumatology.   Final Clinical Impression(s) / ED Diagnoses Final diagnoses:  Acute polyarthritis    Rx / DC Orders ED Discharge Orders          Ordered    HYDROcodone-acetaminophen (NORCO/VICODIN) 5-325 MG tablet  Every 6 hours PRN        07/02/22 1152              Vanetta Mulders, MD 07/02/22 1157

## 2022-07-02 NOTE — Discharge Instructions (Addendum)
Recommend follow-up with primary care doctor.  Take the hydrocodone pain medicine as needed.   Also recommend follow-up with rheumatology which sounds like they are trying to arrange.  Return for any new or worse symptoms.

## 2022-07-02 NOTE — ED Notes (Signed)
Patient verbalizes understanding of discharge instructions. Opportunity for questioning and answers were provided. Pt discharged from ED. 

## 2022-07-02 NOTE — ED Triage Notes (Signed)
Pt c/o issues related to gastritis and osteoarthritis since October; states recently seen for same and has not gotten results; pt states she was sent to ED by PCP but cannot articulate why; pt endorses inflammation and pain in feet, knees, shoulders; pt prescribed tramadol, states relieves pain very temporarily; endorses difficulty with ambulation due to pain

## 2023-04-09 ENCOUNTER — Other Ambulatory Visit: Payer: Self-pay | Admitting: Internal Medicine

## 2023-04-09 DIAGNOSIS — Z1231 Encounter for screening mammogram for malignant neoplasm of breast: Secondary | ICD-10-CM

## 2023-05-21 ENCOUNTER — Ambulatory Visit
Admission: RE | Admit: 2023-05-21 | Discharge: 2023-05-21 | Disposition: A | Discharge status: Home / Self Care | Payer: Medicare HMO | Source: Ambulatory Visit | Attending: Internal Medicine | Admitting: Internal Medicine

## 2023-05-21 DIAGNOSIS — Z1231 Encounter for screening mammogram for malignant neoplasm of breast: Secondary | ICD-10-CM

## 2024-04-06 ENCOUNTER — Other Ambulatory Visit: Payer: Self-pay | Admitting: Internal Medicine

## 2024-04-06 DIAGNOSIS — Z1231 Encounter for screening mammogram for malignant neoplasm of breast: Secondary | ICD-10-CM

## 2024-05-21 ENCOUNTER — Inpatient Hospital Stay: Admission: RE | Admit: 2024-05-21 | Discharge: 2024-05-21 | Attending: Internal Medicine | Admitting: Internal Medicine

## 2024-05-21 DIAGNOSIS — Z1231 Encounter for screening mammogram for malignant neoplasm of breast: Secondary | ICD-10-CM

## 2024-05-25 ENCOUNTER — Ambulatory Visit
